# Patient Record
Sex: Male | Born: 1960 | Race: Black or African American | Hispanic: No | Marital: Married | Smoking: Never smoker
Health system: Southern US, Community
[De-identification: ages and names within clinical notes are randomized; demographics above are authoritative.]

## PROBLEM LIST (undated history)

## (undated) DIAGNOSIS — E785 Hyperlipidemia, unspecified: Secondary | ICD-10-CM

## (undated) DIAGNOSIS — R519 Headache, unspecified: Secondary | ICD-10-CM

## (undated) DIAGNOSIS — I10 Essential (primary) hypertension: Secondary | ICD-10-CM

## (undated) HISTORY — DX: Headache, unspecified: R51.9

## (undated) NOTE — *Deleted (*Deleted)
HEMATOLOGY/ONCOLOGY CLINIC NOTE  Date of Service: 03/24/2020  Patient Care Team: Farris Has, MD as PCP - General (Family Medicine)  CHIEF COMPLAINTS/PURPOSE OF CONSULTATION:  Thrombocytopenia and developing Anemia  HISTORY OF PRESENTING ILLNESS:   Ryan Nixon is a wonderful 37 y.o. male who has been referred to [place] by Dr Kateri Plummer for evaluation and management of thrombocytopenia and developing anemia. The pt reports that he is doing well overall.   The pt reports that the last time he felt like himself was in [place]. He knew that things were different when he was no longer able to play tennis, which he did so often before. Pt has been experiencing nausea, vomiting, fatigue and weight loss. He has had a 17-18 lb weight loss over the course of the last 8 months. His highest weight was around 139 lbs and his lowest was 119 lbs. Pt has had fevers accompanied by cold sweats and headaches over the last few months. His night sweats were drenching, but have since improved. This began around the same time as his other symptoms. He also notes hand peeling in both hands, over the entire palm. He has had a couple of recent episodes of memory loss. Once he wound up in [place] with no knowledge of how he got there. Pt has not had a Colonoscopy since he has turned 49 but has had an EGD, which was set up by his GI, Dr. Ewing Schlein. Nothing significant was found on his EGD. Dr. Ewing Schlein placed the pt on Omeprazole 2 weeks ago and the pt has noticed a severe decrease in his vomiting. He has been eating better and has even gained some of his weight back.   Pt has been taking his migraine medication once per week as prophylaxis and then only as needed. He has had migraine headaches for at least the last few years and does have a sensitivity to light and sound with his migraines. Pt has been on HTN medication for about 10 years and is currently on a half dose of his medication. He believes that his HTN is stable.  He denies any other new medications or symptoms. He recently had an brain MRI and has an appointment with a Neurologist tomorrow.   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 His mother passed last year and her death has had saddened him greatly. He is not sure if he is, or recently has been struggling with depression. His grandmother was diagnosed with dementia in her 47's. He drinks up to a 6 pack of beer when he plays tennis and has 3-4 glasses of wine per week. Pt does not drink much hard liquor at all. Pt has no dietary restrictions and eats at least 2 meals per day.   Of note prior to the patient's visit today, pt has had Brain MRI completed on 03/20/2019 with results revealing "1. No acute intracranial abnormality. 2. Several white matter lesions are identified, as described above. These lesions are abnormal but nonspecific, usually resulting from benign/remote/incidental causes (e.g. prior trauma/inflammation/demyelinization, or chronic ischemia associated with  migraines/atherosclerosis/other vasculopathies)."   Most recent lab results (03/15/2019) of CBC is as follows: WBC at 5.7K, RBC at 3.47, Hgb at 12.5, HCT at 36.5, MCV at 105.3, MCH at 36.0, MCHC at 34.1, RDW at 14.1, PLTs at 113K, MPV at 10.8, nRBC Abs at 0.01K, Neutro Rel at 69.4, nRBC Rel at 0.10, Lymphs Rel at 21.1, Mono Rel at 8.3, Eos Rel at 0.8, Baso Rel at  0.4, Neutro at 3.9K, Lymph Abs at 1.20K, Mono Abs at 0.5K, Eos Abs at 0.0K, Baso Abs at 0.0K, Glucose at 93, BUN at 8, Creatinine at 0.86, GFR Est AFR Am at 110, Sodium at 141, Potassium at 4.1, Chloride at 106, CO2 at 28, Anion gap at 11.8, Calcium at 9.7, Total Protein at 7.4, Albumin at 4.6, Total Bilirubin at 0.5, ALP at 49, AST at 47, ALT at 50.  03/15/2019 TSH at 1.29  On review of systems, pt reports nausea, improving vomiting, significant weight loss, fatigue, b/l hand peeling, improving night sweats. short-term memory loss and denies constipation, abdominal pain,  diarrhea, breathing changes, chest pain, new bone pain, new joint pain, pain along the spine and any other symptoms.   On PMHx the pt reports HTN, Migraines, HLD. On Social Hx the pt reports that he drinks 3-4 glasses of wine per week, up to 6 beers in 1 sitting occasionally.  On Family Hx the pt reports that his grandmother had dementia that was diagnosed in her 42's   INTERVAL HISTORY:  Ryan Nixon is a wonderful 6 y.o. male who is here for evaluation and management of thrombocytopenia and developing anemia. The patient's last visit with [place] was on 12/17/2019. The pt reports that he is doing well overall.  The pt reports ***  Of note since the patient's last visit, pt has had Bone Marrow Bx Report ([id number]) completed on 03/10/2020 with results revealing "BONE MARROW, ASPIRATE, CLOT, CORE: - Normocellular marrow with kappa predominant plasmacytosis."  Pt has had Cytogenetics ([id number]) completed on 03/10/2020 with results revealing Normal Male Karyotype.  Lab results (03/10/20) of CBC w/diff and CMP is as follows: all values are WNL except for RBC at 3.91, MCV at 102.6, Total Protein at 8.3. 03/10/2020 MMP shows all values are WNL except for IgG at 2034, Gamma Glob at 2.0, M Protein at 1.5  On review of systems, pt reports *** and denies ***and any other symptoms.   A&P: -Discussed pt labwork today, 03/24/20; *** -***   MEDICAL HISTORY:  Hyperlipidemia Hypertension   SURGICAL HISTORY: No known surgical history   SOCIAL HISTORY: Social History   Socioeconomic History  . Marital status: Married    Spouse name: Not on file  . Number of children: Not on file  . Years of education: Not on file  . Highest education level: Not on file  Occupational History  . Not on file  Tobacco Use  . Smoking status: Never Smoker  . Smokeless tobacco: Never Used  Substance and Sexual Activity  . Alcohol use: Yes    Alcohol/week: 2.0 standard drinks    Types: 1 Cans of beer, 1  Glasses of wine per week    Comment: occasionally  . Drug use: Not Currently  . Sexual activity: Not on file  Other Topics Concern  . Not on file  Social History Narrative  . Not on file   Social Determinants of Health   Financial Resource Strain:   . Difficulty of Paying Living Expenses: Not on file  Food Insecurity:   . Worried About Programme researcher, broadcasting/film/video in the Last Year: Not on file  . Ran Out of Food in the Last Year: Not on file  Transportation Needs:   . Lack of Transportation (Medical): Not on file  . Lack of Transportation (Non-Medical): Not on file  Physical Activity:   . Days of Exercise per Week: Not on file  . Minutes of Exercise per Session: Not on file  Stress:   . Feeling of Stress : Not on file  Social Connections:   . Frequency of Communication with Friends and Family: Not on file  . Frequency of Social Gatherings with Friends and Family: Not on file  . Attends Religious Services: Not on file  . Active Member of Clubs or Organizations: Not on file  . Attends Banker Meetings: Not on file  . Marital Status: Not on file  Intimate Partner Violence:   . Fear of Current or Ex-Partner: Not on file  . Emotionally Abused: Not on file  . Physically Abused: Not on file  . Sexually Abused: Not on file    FAMILY HISTORY: No family history on file.  ALLERGIES:  has no allergies on file. No known drug allergies   MEDICATIONS:  Current Outpatient Medications  Medication Sig Dispense Refill  . atorvastatin (LIPITOR) 20 MG tablet Take 20 mg by mouth daily.    . chlorthalidone (HYGROTON) 25 MG tablet Take  1/2    . omeprazole (PRILOSEC) 20 MG capsule Take 20 mg by mouth daily.    . ondansetron (ZOFRAN) 8 MG tablet     . Potassium Chloride ER 20 MEQ TBCR     . SUMAtriptan (IMITREX) 100 MG tablet Take 1 tablet with 600 mg of ibuprofen at the first sign of headache, may repeat in Imitrex only in 2 hours if needed.  Max 200/24hrs.    . Vitamin D,  Ergocalciferol, (DRISDOL) 1.25 MG (50000 UNIT) CAPS capsule TAKE 1 CAPSULE BY MOUTH ONE TIME PER WEEK 12 capsule 0   No current facility-administered medications for this visit.    REVIEW OF SYSTEMS:   A 10+ POINT REVIEW OF SYSTEMS WAS OBTAINED including neurology, dermatology, psychiatry, cardiac, respiratory, lymph, extremities, GI, GU, Musculoskeletal, constitutional, breasts, reproductive, HEENT.  All pertinent positives are noted in the HPI.  All others are negative.   PHYSICAL EXAMINATION: ECOG PERFORMANCE STATUS: 1 - Symptomatic but completely ambulatory  *** GENERAL:alert, in no acute distress and comfortable SKIN: no acute rashes, no significant lesions EYES: conjunctiva are pink and non-injected, sclera anicteric OROPHARYNX: MMM, no exudates, no oropharyngeal erythema or ulceration NECK: supple, no JVD LYMPH:  no palpable lymphadenopathy in the cervical, axillary or inguinal regions LUNGS: clear to auscultation b/l with normal respiratory effort HEART: regular rate & rhythm ABDOMEN:  normoactive bowel sounds , non tender, not distended. No palpable hepatosplenomegaly.  Extremity: no pedal edema PSYCH: alert & oriented x 3 with fluent speech NEURO: no focal motor/sensory deficits  LABORATORY DATA:  I have reviewed the data as listed  . CBC Latest Ref Rng & Units 03/10/2020 03/10/2020 12/12/2019  WBC 4.0 - 10.5 K/uL 5.0 4.1 4.9  Hemoglobin 13.0 - 17.0 g/dL 16.1 09.6 04.5  Hematocrit 39 - 52 % 40.8 40.1 39.2  Platelets 150 - 400 K/uL 183 155 164    . CMP Latest Ref Rng & Units 03/10/2020 12/12/2019 06/05/2019  Glucose 70 - 99 mg/dL 72 84 93  BUN 6 - 20 mg/dL 13 6 10   Creatinine 0.61 - 1.24 mg/dL 4.09 8.11 9.14  Sodium 135 - 145 mmol/L 143 140 142  Potassium 3.5 - 5.1 mmol/L 3.6 4.0 3.5  Chloride 98 - 111 mmol/L 106 103 105  CO2 22 - 32 mmol/L 26 29 29   Calcium 8.9 - 10.3 mg/dL 9.0 9.9 9.5  Total Protein 6.5 - 8.1 g/dL 8.3(H) 8.0 8.1  Total Bilirubin 0.3 - 1.2 mg/dL 1.1  1.0 1.2  Alkaline Phos 38 -  126 U/L 63 73 64  AST 15 - 41 U/L 36 44(H) 27  ALT 0 - 44 U/L 27 34 31   03/10/2020 Bone Marrow Bx Report ([id number]):   03/10/2020 Cytogenetics ([id number]):   RADIOGRAPHIC STUDIES: I have personally reviewed the radiological images as listed and agreed with the findings in the report. CT Biopsy  Result Date: 03/10/2020 INDICATION: 55 year old male with possible plasma cell dyscrasia. EXAM: CT GUIDED BONE MARROW ASPIRATION AND CORE BIOPSY Interventional Radiologist:  Sterling Big, MD MEDICATIONS: None. ANESTHESIA/SEDATION: 50 mcg fentanyl only.  This does not constitute moderate sedation. FLUOROSCOPY TIME:  None. COMPLICATIONS: None immediate. Estimated blood loss: <25 mL PROCEDURE: Informed written consent was obtained from the patient after a thorough discussion of the procedural risks, benefits and alternatives. All questions were addressed. Maximal Sterile Barrier Technique was utilized including caps, mask, sterile gowns, sterile gloves, sterile drape, hand hygiene and skin antiseptic. A timeout was performed prior to the initiation of the procedure. The patient was positioned prone and non-contrast localization CT was performed of the pelvis to demonstrate the iliac marrow spaces. Maximal barrier sterile technique utilized including caps, mask, sterile gowns, sterile gloves, large sterile drape, hand hygiene, and betadine prep. Under sterile conditions and local anesthesia, an 11 gauge coaxial bone biopsy needle was advanced into the right iliac marrow space. Needle position was confirmed with CT imaging. Initially, bone marrow aspiration was performed. Next, the 11 gauge outer cannula was utilized to obtain a right iliac bone marrow core biopsy. Needle was removed. Hemostasis was obtained with compression. The patient tolerated the procedure well. Samples were prepared with the cytotechnologist. IMPRESSION: Technically successful CT-guided right iliac bone  marrow aspiration and core biopsy. Electronically Signed   By: Malachy Moan M.D.   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 EXAM: CT GUIDED BONE MARROW ASPIRATION AND CORE BIOPSY Interventional Radiologist:  Sterling Big, MD MEDICATIONS: None. ANESTHESIA/SEDATION: 50 mcg fentanyl only.  This does not constitute moderate sedation. FLUOROSCOPY TIME:  None. COMPLICATIONS: None immediate. Estimated blood loss: <25 mL PROCEDURE: Informed written consent was obtained from the patient after a thorough discussion of the procedural risks, benefits and alternatives. All questions were addressed. Maximal Sterile Barrier Technique was utilized including caps, mask, sterile gowns, sterile gloves, sterile drape, hand hygiene and skin antiseptic. A timeout was performed prior to the initiation of the procedure. The patient was positioned prone and non-contrast localization CT was performed of the pelvis to demonstrate the iliac marrow spaces. Maximal barrier sterile technique utilized including caps, mask, sterile gowns, sterile gloves, large sterile drape, hand hygiene, and betadine prep. Under sterile conditions and local anesthesia, an 11 gauge coaxial bone biopsy needle was advanced into the right iliac marrow space. Needle position was confirmed with CT imaging. Initially, bone marrow aspiration was performed. Next, the 11 gauge outer cannula was utilized to obtain a right iliac bone marrow core biopsy. Needle was removed. Hemostasis was obtained with compression. The patient tolerated the procedure well. Samples were prepared with the cytotechnologist. IMPRESSION: Technically successful CT-guided right iliac bone marrow aspiration and core biopsy. Electronically Signed   By: Malachy Moan M.D.   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 No CT evidence of malignancy in the chest,  abdomen, or pelvis. No lymphadenopathy. 2. Hepatic steatosis. 3. Descending and sigmoid diverticulosis without evidence of diverticulitis. 4. 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 -Continue 19147 IU Vitamin D/Ergocalciferol weekly   FOLLOW UP: ***   The total time spent in the appt was *** minutes and more than 50% was on counseling and direct patient cares.  All of the patient's questions were answered with apparent satisfaction. The patient knows to call the clinic with any problems, questions or concerns.    Wyvonnia Lora MD MS AAHIVMS [facility] Hematology/Oncology Physician [facility]  (Office):       [phone] (Work cell):  [phone] (Fax):           [phone]  03/24/2020 9:21 AM  I, Carollee Herter, am acting as a scribe for Dr. Wyvonnia Lora.   {Add Production assistant, radio Statement}

---

## 2013-02-23 ENCOUNTER — Other Ambulatory Visit: Payer: Self-pay | Admitting: Interventional Cardiology

## 2013-02-23 DIAGNOSIS — E782 Mixed hyperlipidemia: Secondary | ICD-10-CM

## 2013-02-23 NOTE — Telephone Encounter (Signed)
Spoke with pt and he already completed his 8 Weeks of 50,000 Units of Vitamin D twice a week. Pt will come for labwork next week and if needs further prescriptions vitamin D then we will call in rx.

## 2013-02-28 ENCOUNTER — Other Ambulatory Visit: Payer: Self-pay

## 2013-08-15 ENCOUNTER — Encounter: Payer: Self-pay | Admitting: Interventional Cardiology

## 2013-09-18 ENCOUNTER — Ambulatory Visit: Payer: Self-pay | Admitting: Interventional Cardiology

## 2013-10-09 ENCOUNTER — Ambulatory Visit: Payer: Self-pay | Admitting: Interventional Cardiology

## 2013-10-10 ENCOUNTER — Encounter: Payer: Self-pay | Admitting: Interventional Cardiology

## 2018-10-27 ENCOUNTER — Other Ambulatory Visit: Payer: Self-pay | Admitting: Family Medicine

## 2018-10-27 DIAGNOSIS — R7989 Other specified abnormal findings of blood chemistry: Secondary | ICD-10-CM

## 2018-11-07 ENCOUNTER — Other Ambulatory Visit: Payer: Self-pay

## 2018-11-07 ENCOUNTER — Ambulatory Visit
Admission: RE | Admit: 2018-11-07 | Discharge: 2018-11-07 | Disposition: A | Discharge status: Home / Self Care | Payer: 59 | Source: Ambulatory Visit | Attending: Family Medicine | Admitting: Family Medicine

## 2018-11-07 DIAGNOSIS — R7989 Other specified abnormal findings of blood chemistry: Secondary | ICD-10-CM

## 2018-11-07 MED ORDER — IOPAMIDOL (ISOVUE-300) INJECTION 61%
100.0000 mL | Freq: Once | INTRAVENOUS | Status: AC | PRN
  Administered 2018-11-07: 100 mL via INTRAVENOUS

## 2019-03-30 ENCOUNTER — Telehealth: Payer: Self-pay | Admitting: Hematology

## 2019-03-30 NOTE — Telephone Encounter (Signed)
Received a new hem referral from Dr. Lysle Rubens for elevated wbc and platelets. [name] has been cld and scheduled to see Dr. Irene Limbo on 11/30 at 11am. He's been mde aware to arrive 15 minutes early.

## 2019-04-02 ENCOUNTER — Inpatient Hospital Stay: Payer: 59

## 2019-04-02 ENCOUNTER — Inpatient Hospital Stay: Payer: 59 | Attending: Hematology | Admitting: Hematology

## 2019-04-02 ENCOUNTER — Other Ambulatory Visit: Payer: Self-pay

## 2019-04-02 VITALS — BP 112/85 | HR 74 | Temp 98.0°F | Resp 18 | Wt 122.6 lb

## 2019-04-02 DIAGNOSIS — D539 Nutritional anemia, unspecified: Secondary | ICD-10-CM

## 2019-04-02 DIAGNOSIS — R634 Abnormal weight loss: Secondary | ICD-10-CM

## 2019-04-02 DIAGNOSIS — R591 Generalized enlarged lymph nodes: Secondary | ICD-10-CM | POA: Diagnosis not present

## 2019-04-02 DIAGNOSIS — D509 Iron deficiency anemia, unspecified: Secondary | ICD-10-CM | POA: Diagnosis present

## 2019-04-02 DIAGNOSIS — R1010 Upper abdominal pain, unspecified: Secondary | ICD-10-CM

## 2019-04-02 DIAGNOSIS — D696 Thrombocytopenia, unspecified: Secondary | ICD-10-CM | POA: Insufficient documentation

## 2019-04-02 DIAGNOSIS — R61 Generalized hyperhidrosis: Secondary | ICD-10-CM

## 2019-04-02 LAB — CMP (CANCER CENTER ONLY)
ALT: 27 U/L (ref 0–44)
AST: 35 U/L (ref 15–41)
Albumin: 4.8 g/dL (ref 3.5–5.0)
Alkaline Phosphatase: 55 U/L (ref 38–126)
Anion gap: 13 (ref 5–15)
BUN: 14 mg/dL (ref 6–20)
CO2: 27 mmol/L (ref 22–32)
Calcium: 10 mg/dL (ref 8.9–10.3)
Chloride: 99 mmol/L (ref 98–111)
Creatinine: 1.03 mg/dL (ref 0.61–1.24)
GFR, Est AFR Am: >60 mL/min (ref >60)
GFR, Estimated: >60 mL/min (ref >60)
Glucose, Bld: 100 mg/dL — ABNORMAL HIGH (ref 70–99)
Potassium: 3.8 mmol/L (ref 3.5–5.1)
Sodium: 139 mmol/L (ref 135–145)
Total Bilirubin: 1.8 mg/dL — ABNORMAL HIGH (ref 0.3–1.2)
Total Protein: 8.5 g/dL — ABNORMAL HIGH (ref 6.5–8.1)

## 2019-04-02 LAB — CBC WITH DIFFERENTIAL/PLATELET
Abs Immature Granulocytes: 0.02 10*3/uL (ref 0.00–0.07)
Basophils Absolute: 0 10*3/uL (ref 0.0–0.1)
Basophils Relative: 1 %
Eosinophils Absolute: 0 10*3/uL (ref 0.0–0.5)
Eosinophils Relative: 0 %
HCT: 41.1 % (ref 39.0–52.0)
Hemoglobin: 14 g/dL (ref 13.0–17.0)
Immature Granulocytes: 0 %
Lymphocytes Relative: 19 %
Lymphs Abs: 1.2 10*3/uL (ref 0.7–4.0)
MCH: 34.9 pg — ABNORMAL HIGH (ref 26.0–34.0)
MCHC: 34.1 g/dL (ref 30.0–36.0)
MCV: 102.5 fL — ABNORMAL HIGH (ref 80.0–100.0)
Monocytes Absolute: 0.5 10*3/uL (ref 0.1–1.0)
Monocytes Relative: 8 %
Neutro Abs: 4.4 10*3/uL (ref 1.7–7.7)
Neutrophils Relative %: 72 %
Platelets: 171 10*3/uL (ref 150–400)
RBC: 4.01 MIL/uL — ABNORMAL LOW (ref 4.22–5.81)
RDW: 12.2 % (ref 11.5–15.5)
WBC: 6.1 10*3/uL (ref 4.0–10.5)
nRBC: 0 % (ref 0.0–0.2)

## 2019-04-02 LAB — IMMATURE PLATELET FRACTION

## 2019-04-02 LAB — VITAMIN B12: Vitamin B-12: 346 pg/mL (ref 180–914)

## 2019-04-02 LAB — PLATELET BY CITRATE

## 2019-04-02 LAB — LACTATE DEHYDROGENASE: LDH: 171 U/L (ref 98–192)

## 2019-04-02 NOTE — Progress Notes (Signed)
HEMATOLOGY/ONCOLOGY CONSULTATION NOTE  Date of Service: 04/02/2019  Patient Care Team: London Pepper, MD as PCP - General (Family Medicine)  CHIEF COMPLAINTS/PURPOSE OF CONSULTATION:  Thrombocytopenia and developing Anemia  HISTORY OF PRESENTING ILLNESS:   Ryan Nixon is a wonderful 58 y.o. male who has been referred to [place] by Dr Orland Mustard for evaluation and management of thrombocytopenia and developing anemia. The pt reports that he is doing well overall.   The pt reports that the last time he felt like himself was in [place]. He knew that things were different when he was no longer able to play tennis, which he did so often before. Pt has been experiencing nausea, vomiting, fatigue and weight loss. He has had a 17-18 lb weight loss over the course of the last 8 months. His highest weight was around 139 lbs and his lowest was 119 lbs. Pt has had fevers accompanied by cold sweats and headaches over the last few months. His night sweats were drenching, but have since improved. This began around the same time as his other symptoms. He also notes hand peeling in both hands, over the entire palm. He has had a couple of recent episodes of memory loss. Once he wound up in [place] with no knowledge of how he got there. Pt has not had a Colonoscopy since he has turned 41 but has had an EGD, which was set up by his GI, Dr. Watt Climes. Nothing significant was found on his EGD. Dr. Watt Climes placed the pt on Omeprazole 2 weeks ago and the pt has noticed a severe decrease in his vomiting. He has been eating better and has even gained some of his weight back.   Pt has been taking his migraine medication once per week as prophylaxis and then only as needed. He has had migraine headaches for at least the last few years and does have a sensitivity to light and sound with his migraines. Pt has been on HTN medication for about 10 years and is currently on a half dose of his medication. He believes that his HTN is  stable. He denies any other new medications or symptoms. He recently had an brain MRI and has an appointment with a Neurologist tomorrow.   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 His mother passed last year and her death has had saddened him greatly. He is not sure if he is, or recently has been struggling with depression. His grandmother was diagnosed with dementia in her 73's. He drinks up to a 6 pack of beer when he plays tennis and has 3-4 glasses of wine per week. Pt does not drink much hard liquor at all. Pt has no dietary restrictions and eats at least 2 meals per day.   Of note prior to the patient's visit today, pt has had Brain MRI completed on 03/20/2019 with results revealing "1. No acute intracranial abnormality. 2. Several white matter lesions are identified, as described above. These lesions are abnormal but nonspecific, usually resulting from benign/remote/incidental causes (e.g. prior trauma/inflammation/demyelinization, or chronic ischemia associated with  migraines/atherosclerosis/other vasculopathies)."   Most recent lab results (03/15/2019) of CBC is as follows: WBC at 5.7K, RBC at 3.47, Hgb at 12.5, HCT at 36.5, MCV at 105.3, MCH at 36.0, MCHC at 34.1, RDW at 14.1, PLTs at 113K, MPV at 10.8, nRBC Abs at 0.01K, Neutro Rel at 69.4, nRBC Rel at 0.10, Lymphs Rel at 21.1, Mono Rel at 8.3, Eos Rel at 0.8, Baso Rel at  0.4, Neutro at 3.9K, Lymph Abs at 1.20K, Mono Abs at 0.5K, Eos Abs at 0.0K, Baso Abs at 0.0K, Glucose at 93, BUN at 8, Creatinine at 0.86, GFR Est AFR Am at 110, Sodium at 141, Potassium at 4.1, Chloride at 106, CO2 at 28, Anion gap at 11.8, Calcium at 9.7, Total Protein at 7.4, Albumin at 4.6, Total Bilirubin at 0.5, ALP at 49, AST at 47, ALT at 50.  03/15/2019 TSH at 1.29  On review of systems, pt reports nausea, improving vomiting, significant weight loss, fatigue, b/l hand peeling, improving night sweats. short-term memory loss and denies constipation,  abdominal pain, diarrhea, breathing changes, chest pain, new bone pain, new joint pain, pain along the spine and any other symptoms.   On PMHx the pt reports HTN, Migraines, HLD. On Social Hx the pt reports that he drinks 3-4 glasses of wine per week, up to 6 beers in 1 sitting occasionally.  On Family Hx the pt reports that his grandmother had dementia that was diagnosed in her 36's   MEDICAL HISTORY:  Hyperlipidemia Hypertension   SURGICAL HISTORY: No known surgical history   SOCIAL HISTORY: Social History   Socioeconomic History  . Marital status: Married    Spouse name: Not on file  . Number of children: Not on file  . Years of education: Not on file  . Highest education level: Not on file  Occupational History  . Not on file  Social Needs  . Financial resource strain: Not on file  . Food insecurity    Worry: Not on file    Inability: Not on file  . Transportation needs    Medical: Not on file    Non-medical: Not on file  Tobacco Use  . Smoking status: Not on file  Substance and Sexual Activity  . Alcohol use: Not on file  . Drug use: Not on file  . Sexual activity: Not on file  Lifestyle  . Physical activity    Days per week: Not on file    Minutes per session: Not on file  . Stress: Not on file  Relationships  . Social Herbalist on phone: Not on file    Gets together: Not on file    Attends religious service: Not on file    Active member of club or organization: Not on file    Attends meetings of clubs or organizations: Not on file    Relationship status: Not on file  . Intimate partner violence    Fear of current or ex partner: Not on file    Emotionally abused: Not on file    Physically abused: Not on file    Forced sexual activity: Not on file  Other Topics Concern  . Not on file  Social History Narrative  . Not on file    FAMILY HISTORY: No family history on file.  ALLERGIES:  has no allergies on file. No known drug allergies    MEDICATIONS:  No current outpatient medications on file.   No current facility-administered medications for this visit.     REVIEW OF SYSTEMS:    10 Point review of Systems was done is negative except as noted above.  PHYSICAL EXAMINATION: ECOG PERFORMANCE STATUS: 1 - Symptomatic but completely ambulatory  . Vitals:   04/02/19 1135  BP: 112/85  Pulse: 74  Resp: 18  Temp: 98 F (36.7 C)   Filed Weights   04/02/19 1135  Weight: 122 lb 9.6 oz (55.6 kg)   .  There is no height or weight on file to calculate BMI.  GENERAL:alert, in no acute distress and comfortable SKIN: no acute rashes, no significant lesions EYES: conjunctiva are pink and non-injected, sclera anicteric OROPHARYNX: MMM, no exudates, no oropharyngeal erythema or ulceration NECK: supple, no JVD LYMPH:  no palpable lymphadenopathy in the axillary or inguinal regions. small b/l cervical lymph nodes  LUNGS: clear to auscultation b/l with normal respiratory effort HEART: regular rate & rhythm ABDOMEN:  normoactive bowel sounds , non tender, not distended. Extremity: no pedal edema PSYCH: alert & oriented x 3 with fluent speech NEURO: no focal motor/sensory deficits  LABORATORY DATA:  I have reviewed the data as listed  . CBC Latest Ref Rng & Units 04/02/2019  WBC 4.0 - 10.5 K/uL 6.1  Hemoglobin 13.0 - 17.0 g/dL 14.0  Hematocrit 39.0 - 52.0 % 41.1  Platelets 150 - 400 K/uL 171    . CMP Latest Ref Rng & Units 04/02/2019  Glucose 70 - 99 mg/dL 100(H)  BUN 6 - 20 mg/dL 14  Creatinine 0.61 - 1.24 mg/dL 1.03  Sodium 135 - 145 mmol/L 139  Potassium 3.5 - 5.1 mmol/L 3.8  Chloride 98 - 111 mmol/L 99  CO2 22 - 32 mmol/L 27  Calcium 8.9 - 10.3 mg/dL 10.0  Total Protein 6.5 - 8.1 g/dL 8.5(H)  Total Bilirubin 0.3 - 1.2 mg/dL 1.8(H)  Alkaline Phos 38 - 126 U/L 55  AST 15 - 41 U/L 35  ALT 0 - 44 U/L 27     RADIOGRAPHIC STUDIES: I have personally reviewed the radiological images as listed and agreed with  the findings in the report. No results found.  ASSESSMENT & PLAN:   31 with   1) Mild thrombocytopenia 2) Mild drop in hgb with mild borderline anemia 3) significant unexplained weight loss PLAN: -Discussed patient's most recent labs from 03/15/2019, WBC at 5.7K, RBC at 3.47, Hgb at 12.5, HCT at 36.5, MCV at 105.3, MCH at 36.0, MCHC at 34.1, RDW at 14.1, PLTs at 113K, MPV at 10.8, nRBC Abs at 0.01K, Neutro Rel at 69.4, nRBC Rel at 0.10, Lymphs Rel at 21.1, Mono Rel at 8.3, Eos Rel at 0.8, Baso Rel at 0.4, Neutro at 3.9K, Lymph Abs at 1.20K, Mono Abs at 0.5K, Eos Abs at 0.0K, Baso Abs at 0.0K, Glucose at 93, BUN at 8, Creatinine at 0.86, GFR Est AFR Am at 110, Sodium at 141, Potassium at 4.1, Chloride at 106, CO2 at 28, Anion gap at 11.8, Calcium at 9.7, Total Protein at 7.4, Albumin at 4.6, Total Bilirubin at 0.5, ALP at 49, AST at 47, ALT at 50. TSH at 1.29 -Pt is macrocytic, which could be due to nutritional deficiency, RBC loss due to hemolysis or something that can be infiltrating the BM to increase the size of RBCs  -Will consider a BM Bx based on the results of labs and CT C/A/P -Advised pt to continue f/u with Neurologist  -Will get labs today  -Will get CT C/A/P in 1 week -Will see back in 2 weeks via phone    FOLLOW UP: Labs today CT neck/chest/abd/pelvis in 1 week Phone visit with Dr Irene Limbo in 2 weeks  All of the patients questions were answered with apparent satisfaction. The patient knows to call the clinic with any problems, questions or concerns.  I spent 35 mins counseling the patient face to face. The total time spent in the appointment was 45 minutes and more than 50% was on counseling and  direct patient cares.    [name] MD [place] AAHIVMS [facility] Hematology/Oncology Physician [facility]  (Office):       [phone] (Work cell):  [phone] (Fax):           [phone]  04/02/2019 5:34 PM  I, Yevette Edwards, am acting as a scribe for Dr.  [name].   .I have reviewed the above documentation for accuracy and completeness, and I agree with the above. Brunetta Genera MD

## 2019-04-02 NOTE — Patient Instructions (Signed)
Thank you for choosing [facility] to provide your oncology and hematology care.   Should you have questions after your visit to the [facility] ([facility]), please contact this office at [phone] between 8:30 AM and 4:30 PM.  Voice mails left after 4:00 PM may not be returned until the following business day.  Calls received after 4:30 PM will be answered by an off-site Nurse Triage Line.    Prescription Refills:  Please have your pharmacy contact us directly for most prescription requests.  Contact the office directly for refills of narcotics (pain medications). Allow 48-72 hours for refills.  Appointments: Please contact the [facility] scheduling department [phone] for questions regarding [facility] appointment scheduling.  Contact the schedulers with any scheduling changes so that your appointment can be rescheduled in a timely manner.   Central Scheduling for [place] [phone] - Call to schedule procedures such as PET scans, CT scans, MRI, Ultrasound, etc.  To afford each patient quality time with our providers, please arrive 30 minutes before your scheduled appointment time.  If you arrive late for your appointment, you may be asked to reschedule.  We strive to give you quality time with our providers, and arriving late affects you and other patients whose appointments are after yours. If you are a no show for multiple scheduled visits, you may be dismissed from the clinic at the providers discretion.     Resources: [facility] Social Workers [phone] for additional information on assistance programs --Anne Cunningham/Abigail Elmore  [place] DSS  [phone]: Information regarding food stamps, Medicaid, and utility assistance [place] 336-333-6589   [place] [employer]'s shared-ride transportation service for eligible riders who have a disability that prevents them from riding the fixed route bus.   Medicare Rights Center 800-333-4114 Helps people with  Medicare understand their rights and benefits, navigate the Medicare system, and secure the quality healthcare they deserve [employer] 800-227-2345 Assists patients locate various types of support and financial assistance Cancer Care: 1-800-813-HOPE (4673) Provides financial assistance, online support groups, medication/co-pay assistance.   Transportation Assistance for appointments at [facility]: Transportation Coordinator [phone]  Again, thank you for choosing [facility] for your care.       

## 2019-04-03 ENCOUNTER — Telehealth: Payer: Self-pay | Admitting: Hematology

## 2019-04-03 ENCOUNTER — Encounter: Payer: Self-pay | Admitting: Neurology

## 2019-04-03 ENCOUNTER — Ambulatory Visit: Payer: 59 | Admitting: Neurology

## 2019-04-03 VITALS — BP 105/75 | HR 95 | Temp 97.6°F | Ht 67.0 in | Wt 126.0 lb

## 2019-04-03 DIAGNOSIS — R413 Other amnesia: Secondary | ICD-10-CM | POA: Diagnosis not present

## 2019-04-03 DIAGNOSIS — G43009 Migraine without aura, not intractable, without status migrainosus: Secondary | ICD-10-CM

## 2019-04-03 DIAGNOSIS — G3184 Mild cognitive impairment, so stated: Secondary | ICD-10-CM

## 2019-04-03 LAB — MULTIPLE MYELOMA PANEL, SERUM
Albumin SerPl Elph-Mcnc: 4.5 g/dL — ABNORMAL HIGH (ref 2.9–4.4)
Albumin/Glob SerPl: 1.3 (ref 0.7–1.7)
Alpha 1: 0.2 g/dL (ref 0.0–0.4)
Alpha2 Glob SerPl Elph-Mcnc: 0.5 g/dL (ref 0.4–1.0)
B-Globulin SerPl Elph-Mcnc: 1 g/dL (ref 0.7–1.3)
Gamma Glob SerPl Elph-Mcnc: 1.8 g/dL (ref 0.4–1.8)
Globulin, Total: 3.6 g/dL (ref 2.2–3.9)
IgA: 126 mg/dL (ref 90–386)
IgG (Immunoglobin G), Serum: 2133 mg/dL — ABNORMAL HIGH (ref 603–1613)
IgM (Immunoglobulin M), Srm: 44 mg/dL (ref 20–172)
M Protein SerPl Elph-Mcnc: 1.4 g/dL — ABNORMAL HIGH
Total Protein ELP: 8.1 g/dL (ref 6.0–8.5)

## 2019-04-03 LAB — FOLATE RBC
Folate, Hemolysate: 306 ng/mL
Folate, RBC: 754 ng/mL (ref >498)
Hematocrit: 40.6 % (ref 37.5–51.0)

## 2019-04-03 LAB — KAPPA/LAMBDA LIGHT CHAINS
Kappa free light chain: 18 mg/L (ref 3.3–19.4)
Kappa, lambda light chain ratio: 2.25 — ABNORMAL HIGH (ref 0.26–1.65)
Lambda free light chains: 8 mg/L (ref 5.7–26.3)

## 2019-04-03 LAB — HAPTOGLOBIN: Haptoglobin: 43 mg/dL (ref 29–370)

## 2019-04-03 NOTE — Telephone Encounter (Signed)
Scheduled appt per 11/30 los.  Spoke with pt and he is aware of the appt date and time.

## 2019-04-03 NOTE — Progress Notes (Signed)
[facility] [address] [place]. [place] [address] [phone]       OFFICE CONSULT NOTE  Ryan Nixon Date of Birth:  17-Feb-1961 Medical Record Number:  [id number]   Referring MD: Ryan Nixon  Reason for Referral: Migraines and disorientation  HPI: [name] is a pleasant 58 year old African-American male with past medical history of migraines, hyperlipidemia and recent diagnosis of anemia and thrombocytopenia and determine etiology.  History is obtained from him and review of referral notes and electronic medical records. Patient states he has a 10-year history of migraine headaches which of the common type without any vision symptoms or orals.  The headache begins with a pulsating vein of the left temple followed by initially mild headache which if it does not take anything goes on to become a moderate to severe headache which may last for hours.  There is some nausea and occasional vomiting.  There is light and sound sensitivity.  He needs to lie down and stop what he is doing.  He used to usually take ibuprofen 600 mg at the onset if this did not relieve he would take Imitrex which would work quite well.  Patient recently started having more nausea vomiting and saw his primary care physician who ordered some tests he was found to have thrombocytopenia and he has been referred to oncology and was seen yesterday and has had lots of lab work done the results of which are not yet back.  He was also found to have mild anemia.  Patient states his migraine headaches are more or less fairly steady occur once or twice a month at the most.  He has never been on prophylactic drugs.  Imitrex seems to work quite well.  He denies any focal neurological symptoms accompanying his migraines.  However he had an episode a month ago when he was driving he is lost track of time for short.Marland Kitchen  He found himself in the wrist area and had temporarily lost his bearings and did not know where he  was.  He had to call somebody to figure out where he was and subsequently he was able to drive without problem.  On inquiry he admits to having noted some short-term memory difficulties this year.  He has trouble remembering recent information and appointments at times.  Is never gotten lost.  Is still independent and with activities of daily living.  He denies any worsening headaches, gait or balance problems.  There is no history of seizures, significant loss of consciousness or head injury.  There is no family history of dementia or seizures.  Patient states he had MRI scan of the brain done 2 weeks ago at tried imaging but I do not have either the films of the reports but the patient informs me that it was normal. ROS:   14 system review of systems is positive for memory loss, confusion, disorientation, headache and all other systems negative  PMH: No past medical history on file.  Social History:  Social History   Socioeconomic History  . Marital status: Married    Spouse name: Not on file  . Number of children: Not on file  . Years of education: Not on file  . Highest education level: Not on file  Occupational History  . Not on file  Social Needs  . Financial resource strain: Not on file  . Food insecurity    Worry: Not on file    Inability: Not on file  . Transportation needs  Medical: Not on file    Non-medical: Not on file  Tobacco Use  . Smoking status: Never Smoker  . Smokeless tobacco: Never Used  Substance and Sexual Activity  . Alcohol use: Yes    Alcohol/week: 2.0 standard drinks    Types: 1 Cans of beer, 1 Glasses of wine per week    Comment: occasionally  . Drug use: Not Currently  . Sexual activity: Not on file  Lifestyle  . Physical activity    Days per week: Not on file    Minutes per session: Not on file  . Stress: Not on file  Relationships  . Social Herbalist on phone: Not on file    Gets together: Not on file    Attends religious  service: Not on file    Active member of club or organization: Not on file    Attends meetings of clubs or organizations: Not on file    Relationship status: Not on file  . Intimate partner violence    Fear of current or ex partner: Not on file    Emotionally abused: Not on file    Physically abused: Not on file    Forced sexual activity: Not on file  Other Topics Concern  . Not on file  Social History Narrative  . Not on file    Medications:   Current Outpatient Medications on File Prior to Visit  Medication Sig Dispense Refill  . atorvastatin (LIPITOR) 20 MG tablet Take 20 mg by mouth daily.    . chlorthalidone (HYGROTON) 25 MG tablet     . omeprazole (PRILOSEC) 20 MG capsule Take 20 mg by mouth daily.    . ondansetron (ZOFRAN) 8 MG tablet     . Potassium Chloride ER 20 MEQ TBCR     . SUMAtriptan (IMITREX) 100 MG tablet Take 1 tablet with 600 mg of ibuprofen at the first sign of headache, may repeat in Imitrex only in 2 hours if needed.  Max 200/24hrs.     No current facility-administered medications on file prior to visit.     Allergies:  Not on File  Physical Exam General: Frail middle-aged African-American male seated, in no evident distress Head: head normocephalic and atraumatic.   Neck: supple with no carotid or supraclavicular bruits Cardiovascular: regular rate and rhythm, no murmurs Musculoskeletal: no deformity Skin:  no rash/petichiae Vascular:  Normal pulses all extremities  Neurologic Exam Mental Status: Awake and fully alert. Oriented to place and time. Recent and remote memory intact. Attention span, concentration and fund of knowledge appropriate. Mood and affect appropriate.  Diminished recall 0/3.  Able to name 12 animals which can walk on 4 legs.  Does not appear depressed. Cranial Nerves: Fundoscopic exam reveals sharp disc margins. Pupils equal, briskly reactive to light. Extraocular movements full without nystagmus. Visual fields full to confrontation.  Hearing intact. Facial sensation intact. Face, tongue, palate moves normally and symmetrically.  Motor: Normal bulk and tone. Normal strength in all tested extremity muscles. Sensory.: intact to touch , pinprick , position and vibratory sensation.  Coordination: Rapid alternating movements normal in all extremities. Finger-to-nose and heel-to-shin performed accurately bilaterally. Gait and Station: Arises from chair without difficulty. Stance is normal. Gait demonstrates normal stride length and balance . Able to heel, toe and tandem walk without difficulty.  Reflexes: 1+ and symmetric. Toes downgoing.       ASSESSMENT: 58 year old male with longstanding history of migraines which appears stable as well as mild cognitive impairment  and one transient episode of disorientation of unclear etiology.  Negative recent MRI scan of the brain     PLAN: I had a long discussion with the patient with regards to his migraine headaches which appear fairly stable and well controlled on Imitrex for symptomatic relief.  I advised him to avoid migraine triggers.  I recommend he switch from ibuprofen as needed to Tylenol due to his thrombocytopenia.  We also discussed his episode of brief memory loss and disorientation as well as short-term memory difficulties and recommend checking memory panel labs and EEG.  Greater than 50% time during this 45-minute consultation visit was spent on counseling and coordination of care about his episode of transient disorientation, short-term memory difficulties and migraines and answering questions.  I also advised memory compensation strategies.  He will return for follow-up in the future in 3 months or call earlier if necessary. Antony Contras, MD  [facility] [address] [place], [place] [address]  Phone [phone] Fax [phone] Note: This document was prepared with digital dictation and possible smart phrase technology. Any  transcriptional errors that result from this process are unintentional.

## 2019-04-03 NOTE — Patient Instructions (Signed)
I had a long discussion with the patient with regards to his migraine headaches which appear fairly stable and well controlled on Imitrex for symptomatic relief.  I advised him to avoid migraine triggers.  I recommend he switch from ibuprofen as needed to Tylenol due to his thrombocytopenia.  We also discussed his episode of brief memory loss and disorientation as well as short-term memory difficulties and recommend checking memory panel labs and EEG.  I also advised memory compensation strategies.  He will return for follow-up in the future in 3 months or call earlier if necessary. Memory Compensation Strategies  1. Use "WARM" strategy.  W= write it down  A= associate it  R= repeat it  M= make a mental note  2.   You can keep a Social worker.  Use a 3-ring notebook with sections for the following: calendar, important names and phone numbers,  medications, doctors' names/phone numbers, lists/reminders, and a section to journal what you did  each day.   3.    Use a calendar to write appointments down.  4.    Write yourself a schedule for the day.  This can be placed on the calendar or in a separate section of the Memory Notebook.  Keeping a  regular schedule can help memory.  5.    Use medication organizer with sections for each day or morning/evening pills.  You may need help loading it  6.    Keep a basket, or pegboard by the door.  Place items that you need to take out with you in the basket or on the pegboard.  You may also want to  include a message board for reminders.  7.    Use sticky notes.  Place sticky notes with reminders in a place where the task is performed.  For example: " turn off the  stove" placed by the stove, "lock the door" placed on the door at eye level, " take your medications" on  the bathroom mirror or by the place where you normally take your medications.  8.    Use alarms/timers.  Use while cooking to remind yourself to check on food or as a reminder to take  your medicine, or as a  reminder to make a call, or as a reminder to perform another task, etc.  Migraine Headache A migraine headache is a very strong throbbing pain on one side or both sides of your head. This type of headache can also cause other symptoms. It can last from 4 hours to 3 days. Talk with your doctor about what things may bring on (trigger) this condition. What are the causes? The exact cause of this condition is not known. This condition may be triggered or caused by:  Drinking alcohol.  Smoking.  Taking medicines, such as: ? Medicine used to treat chest pain (nitroglycerin). ? Birth control pills. ? Estrogen. ? Some blood pressure medicines.  Eating or drinking certain products.  Doing physical activity. Other things that may trigger a migraine headache include:  Having a menstrual period.  Pregnancy.  Hunger.  Stress.  Not getting enough sleep or getting too much sleep.  Weather changes.  Tiredness (fatigue). What increases the risk?  Being 35-65 years old.  Being male.  Having a family history of migraine headaches.  Being Caucasian.  Having depression or anxiety.  Being very overweight. What are the signs or symptoms?  A throbbing pain. This pain may: ? Happen in any area of the head, such as on  one side or both sides. ? Make it hard to do daily activities. ? Get worse with physical activity. ? Get worse around bright lights or loud noises.  Other symptoms may include: ? Feeling sick to your stomach (nauseous). ? Vomiting. ? Dizziness. ? Being sensitive to bright lights, loud noises, or smells.  Before you get a migraine headache, you may get warning signs (an aura). An aura may include: ? Seeing flashing lights or having blind spots. ? Seeing bright spots, halos, or zigzag lines. ? Having tunnel vision or blurred vision. ? Having numbness or a tingling feeling. ? Having trouble talking. ? Having weak muscles.  Some people  have symptoms after a migraine headache (postdromal phase), such as: ? Tiredness. ? Trouble thinking (concentrating). How is this treated?  Taking medicines that: ? Relieve pain. ? Relieve the feeling of being sick to your stomach. ? Prevent migraine headaches.  Treatment may also include: ? Having acupuncture. ? Avoiding foods that bring on migraine headaches. ? Learning ways to control your body functions (biofeedback). ? Therapy to help you know and deal with negative thoughts (cognitive behavioral therapy). Follow these instructions at home: Medicines  Take over-the-counter and prescription medicines only as told by your doctor.  Ask your doctor if the medicine prescribed to you: ? Requires you to avoid driving or using heavy machinery. ? Can cause trouble pooping (constipation). You may need to take these steps to prevent or treat trouble pooping:  Drink enough fluid to keep your pee (urine) pale yellow.  Take over-the-counter or prescription medicines.  Eat foods that are high in fiber. These include beans, whole grains, and fresh fruits and vegetables.  Limit foods that are high in fat and sugar. These include fried or sweet foods. Lifestyle  Do not drink alcohol.  Do not use any products that contain nicotine or tobacco, such as cigarettes, e-cigarettes, and chewing tobacco. If you need help quitting, ask your doctor.  Get at least 8 hours of sleep every night.  Limit and deal with stress. General instructions      Keep a journal to find out what may bring on your migraine headaches. For example, write down: ? What you eat and drink. ? How much sleep you get. ? Any change in what you eat or drink. ? Any change in your medicines.  If you have a migraine headache: ? Avoid things that make your symptoms worse, such as bright lights. ? It may help to lie down in a dark, quiet room. ? Do not drive or use heavy machinery. ? Ask your doctor what activities are  safe for you.  Keep all follow-up visits as told by your doctor. This is important. Contact a doctor if:  You get a migraine headache that is different or worse than others you have had.  You have more than 15 headache days in one month. Get help right away if:  Your migraine headache gets very bad.  Your migraine headache lasts longer than 72 hours.  You have a fever.  You have a stiff neck.  You have trouble seeing.  Your muscles feel weak or like you cannot control them.  You start to lose your balance a lot.  You start to have trouble walking.  You pass out (faint).  You have a seizure. Summary  A migraine headache is a very strong throbbing pain on one side or both sides of your head. These headaches can also cause other symptoms.  This condition may  be treated with medicines and changes to your lifestyle.  Keep a journal to find out what may bring on your migraine headaches.  Contact a doctor if you get a migraine headache that is different or worse than others you have had.  Contact your doctor if you have more than 15 headache days in a month. This information is not intended to replace advice given to you by your health care provider. Make sure you discuss any questions you have with your health care provider. Document Released: 01/27/2008 Document Revised: 08/11/2018 Document Reviewed: 06/01/2018 [employer]vier Patient Education  2020 [employer].

## 2019-04-04 LAB — COPPER, SERUM: Copper: 102 ug/dL (ref 72–166)

## 2019-04-05 ENCOUNTER — Telehealth: Payer: Self-pay

## 2019-04-05 LAB — DEMENTIA PANEL
Homocysteine: 17.4 umol/L — ABNORMAL HIGH (ref 0.0–14.5)
RPR Ser Ql: NONREACTIVE
TSH: 2.25 u[IU]/mL (ref 0.450–4.500)
Vitamin B-12: 446 pg/mL (ref 232–1245)

## 2019-04-05 NOTE — Telephone Encounter (Signed)
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  In case he is a smoker he needs to quit.  He needs to start taking folic acid 1 mg daily which should be available over-the-counter

## 2019-04-05 NOTE — Telephone Encounter (Signed)
I called pt that his thyroid level and b12 were normal. His homocysteine levels are elevated. PT stated he does not smoke but does drink occasionally. I advise pt per Dr.Sethi to start taking folic acid 1mg  daily which is available over the counter at any drug store. PT verbalized understanding.  ------

## 2019-04-10 ENCOUNTER — Ambulatory Visit (HOSPITAL_COMMUNITY): Payer: 59

## 2019-04-10 ENCOUNTER — Encounter (HOSPITAL_COMMUNITY): Payer: Self-pay

## 2019-04-10 ENCOUNTER — Other Ambulatory Visit: Payer: Self-pay

## 2019-04-10 ENCOUNTER — Ambulatory Visit (HOSPITAL_COMMUNITY)
Admission: RE | Admit: 2019-04-10 | Discharge: 2019-04-10 | Disposition: A | Discharge status: Home / Self Care | Payer: 59 | Source: Ambulatory Visit | Attending: Hematology | Admitting: Hematology

## 2019-04-10 DIAGNOSIS — R634 Abnormal weight loss: Secondary | ICD-10-CM

## 2019-04-10 DIAGNOSIS — R1010 Upper abdominal pain, unspecified: Secondary | ICD-10-CM | POA: Insufficient documentation

## 2019-04-10 DIAGNOSIS — D696 Thrombocytopenia, unspecified: Secondary | ICD-10-CM | POA: Diagnosis present

## 2019-04-10 DIAGNOSIS — R61 Generalized hyperhidrosis: Secondary | ICD-10-CM

## 2019-04-10 DIAGNOSIS — R591 Generalized enlarged lymph nodes: Secondary | ICD-10-CM | POA: Diagnosis present

## 2019-04-10 DIAGNOSIS — D539 Nutritional anemia, unspecified: Secondary | ICD-10-CM

## 2019-04-10 MED ORDER — IOHEXOL 300 MG/ML  SOLN
100.0000 mL | Freq: Once | INTRAMUSCULAR | Status: AC | PRN
  Administered 2019-04-10: 100 mL via INTRAVENOUS

## 2019-04-16 ENCOUNTER — Ambulatory Visit (INDEPENDENT_AMBULATORY_CARE_PROVIDER_SITE_OTHER): Payer: Managed Care, Other (non HMO) | Admitting: Neurology

## 2019-04-16 ENCOUNTER — Telehealth: Payer: Self-pay | Admitting: Hematology

## 2019-04-16 ENCOUNTER — Other Ambulatory Visit: Payer: Self-pay

## 2019-04-16 DIAGNOSIS — R413 Other amnesia: Secondary | ICD-10-CM

## 2019-04-16 DIAGNOSIS — R410 Disorientation, unspecified: Secondary | ICD-10-CM | POA: Diagnosis not present

## 2019-04-16 NOTE — Progress Notes (Signed)
HEMATOLOGY/ONCOLOGY CONSULTATION NOTE  Date of Service: 04/17/2019  Patient Care Team: London Pepper, MD as PCP - General (Family Medicine)  CHIEF COMPLAINTS/PURPOSE OF CONSULTATION:  Thrombocytopenia and developing Anemia  HISTORY OF PRESENTING ILLNESS:   Ryan Nixon is a wonderful 58 y.o. male who has been referred to [place] by Dr Orland Mustard for evaluation and management of thrombocytopenia and developing anemia. The pt reports that he is doing well overall.   The pt reports that the last time he felt like himself was in [place]. He knew that things were different when he was no longer able to play tennis, which he did so often before. Pt has been experiencing nausea, vomiting, fatigue and weight loss. He has had a 17-18 lb weight loss over the course of the last 8 months. His highest weight was around 139 lbs and his lowest was 119 lbs. Pt has had fevers accompanied by cold sweats and headaches over the last few months. His night sweats were drenching, but have since improved. This began around the same time as his other symptoms. He also notes hand peeling in both hands, over the entire palm. He has had a couple of recent episodes of memory loss. Once he wound up in [place] with no knowledge of how he got there. Pt has not had a Colonoscopy since he has turned 13 but has had an EGD, which was set up by his GI, Dr. Watt Climes. Nothing significant was found on his EGD. Dr. Watt Climes placed the pt on Omeprazole 2 weeks ago and the pt has noticed a severe decrease in his vomiting. He has been eating better and has even gained some of his weight back.   Pt has been taking his migraine medication once per week as prophylaxis and then only as needed. He has had migraine headaches for at least the last few years and does have a sensitivity to light and sound with his migraines. Pt has been on HTN medication for about 10 years and is currently on a half dose of his medication. He believes that his HTN is  stable. He denies any other new medications or symptoms. He recently had an brain MRI and has an appointment with a Neurologist tomorrow.   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 His mother passed last year and her death has had saddened him greatly. He is not sure if he is, or recently has been struggling with depression. His grandmother was diagnosed with dementia in her 65's. He drinks up to a 6 pack of beer when he plays tennis and has 3-4 glasses of wine per week. Pt does not drink much hard liquor at all. Pt has no dietary restrictions and eats at least 2 meals per day.   Of note prior to the patient's visit today, pt has had Brain MRI completed on 03/20/2019 with results revealing "1. No acute intracranial abnormality. 2. Several white matter lesions are identified, as described above. These lesions are abnormal but nonspecific, usually resulting from benign/remote/incidental causes (e.g. prior trauma/inflammation/demyelinization, or chronic ischemia associated with  migraines/atherosclerosis/other vasculopathies)."   Most recent lab results (03/15/2019) of CBC is as follows: WBC at 5.7K, RBC at 3.47, Hgb at 12.5, HCT at 36.5, MCV at 105.3, MCH at 36.0, MCHC at 34.1, RDW at 14.1, PLTs at 113K, MPV at 10.8, nRBC Abs at 0.01K, Neutro Rel at 69.4, nRBC Rel at 0.10, Lymphs Rel at 21.1, Mono Rel at 8.3, Eos Rel at 0.8, Baso Rel at  0.4, Neutro at 3.9K, Lymph Abs at 1.20K, Mono Abs at 0.5K, Eos Abs at 0.0K, Baso Abs at 0.0K, Glucose at 93, BUN at 8, Creatinine at 0.86, GFR Est AFR Am at 110, Sodium at 141, Potassium at 4.1, Chloride at 106, CO2 at 28, Anion gap at 11.8, Calcium at 9.7, Total Protein at 7.4, Albumin at 4.6, Total Bilirubin at 0.5, ALP at 49, AST at 47, ALT at 50.  03/15/2019 TSH at 1.29  On review of systems, pt reports nausea, improving vomiting, significant weight loss, fatigue, b/l hand peeling, improving night sweats. short-term memory loss and denies constipation,  abdominal pain, diarrhea, breathing changes, chest pain, new bone pain, new joint pain, pain along the spine and any other symptoms.   On PMHx the pt reports HTN, Migraines, HLD. On Social Hx the pt reports that he drinks 3-4 glasses of wine per week, up to 6 beers in 1 sitting occasionally.  On Family Hx the pt reports that his grandmother had dementia that was diagnosed in her 41's   INTERVAL HISTORY:   I connected with  Ryan Nixon on 04/17/19 by telephone and verified that I am speaking with the correct person using two identifiers.   I discussed the limitations of evaluation and management by telemedicine. The patient expressed understanding and agreed to proceed.  Other persons participating in the visit and their role in the encounter:     -Yevette Edwards, Medical Scribe  Patient's location: Home Provider's location: [facility] [place] is a wonderful 58 y.o. male who is here for evaluation and management of thrombocytopenia and developing anemia. The patient's last visit with [place] was on 04/02/2019. The pt reports that he is doing well overall.  The pt reports that he has seen Dr. Leonie Man, Neurologist, who found nothing concerning on his MRI. Pt has also had some labwork done for Dr. Leonie Man and an EEG, which has not yet resulted. Pt has felt better since our last visit. He has had significant improvement in his fatigue and is back to playing tennis at this time.   Of note since the patient's last visit, pt has had CT C/A/P ([id number]) ([id number]) completed on 04/10/2019 with results revealing "1. No CT evidence of malignancy in the chest, abdomen, or pelvis. No lymphadenopathy. 2. Hepatic steatosis. 3. Descending and sigmoid diverticulosis without evidence of diverticulitis. 4. Mild, diffusely sclerotic appearance of the skeleton without focal lesion noted, of uncertain significance, most commonly seen with renal osteodystrophy."  Lab results  (04/02/19) of CBC w/diff and CMP is as follows: all values are WNL except for RBC at 4.01, MCV at 102.5, MCH at 34.9, Glucose at 100, Total Protein at 8.5, Total Bilirubin at 1.8. 04/02/2019 LDH at 171 04/02/2019 Copper at 102 04/02/2019 Haptoglobin at 43 04/02/2019 Vitamin B12 at 346 04/02/2019 Folate Hemolysate at 306.0, HCT at 40.6, Folate RBC at 754 04/02/2019 K/L light chains is as follows: all values are WNL except for K/L light chain ratio at 2.25 04/02/2019 MMP is as follows: all values are WNL except for IgG at 2133, Albumin at 4.5, M Protein at 1.4.   On review of systems, pt reports improving night sweats and denies fatigue, weight loss, new bone pains and any other symptoms.   MEDICAL HISTORY:  Hyperlipidemia Hypertension   SURGICAL HISTORY: No known surgical history   SOCIAL HISTORY: Social History   Socioeconomic History  . Marital status: Married    Spouse name: Not on file  .  Number of children: Not on file  . Years of education: Not on file  . Highest education level: Not on file  Occupational History  . Not on file  Tobacco Use  . Smoking status: Never Smoker  . Smokeless tobacco: Never Used  Substance and Sexual Activity  . Alcohol use: Yes    Alcohol/week: 2.0 standard drinks    Types: 1 Cans of beer, 1 Glasses of wine per week    Comment: occasionally  . Drug use: Not Currently  . Sexual activity: Not on file  Other Topics Concern  . Not on file  Social History Narrative  . Not on file   Social Determinants of Health   Financial Resource Strain:   . Difficulty of Paying Living Expenses: Not on file  Food Insecurity:   . Worried About Charity fundraiser in the Last Year: Not on file  . Ran Out of Food in the Last Year: Not on file  Transportation Needs:   . Lack of Transportation (Medical): Not on file  . Lack of Transportation (Non-Medical): Not on file  Physical Activity:   . Days of Exercise per Week: Not on file  . Minutes of Exercise  per Session: Not on file  Stress:   . Feeling of Stress : Not on file  Social Connections:   . Frequency of Communication with Friends and Family: Not on file  . Frequency of Social Gatherings with Friends and Family: Not on file  . Attends Religious Services: Not on file  . Active Member of Clubs or Organizations: Not on file  . Attends Archivist Meetings: Not on file  . Marital Status: Not on file  Intimate Partner Violence:   . Fear of Current or Ex-Partner: Not on file  . Emotionally Abused: Not on file  . Physically Abused: Not on file  . Sexually Abused: Not on file    FAMILY HISTORY: No family history on file.  ALLERGIES:  has no allergies on file. No known drug allergies   MEDICATIONS:  Current Outpatient Medications  Medication Sig Dispense Refill  . atorvastatin (LIPITOR) 20 MG tablet Take 20 mg by mouth daily.    . chlorthalidone (HYGROTON) 25 MG tablet     . omeprazole (PRILOSEC) 20 MG capsule Take 20 mg by mouth daily.    . ondansetron (ZOFRAN) 8 MG tablet     . Potassium Chloride ER 20 MEQ TBCR     . SUMAtriptan (IMITREX) 100 MG tablet Take 1 tablet with 600 mg of ibuprofen at the first sign of headache, may repeat in Imitrex only in 2 hours if needed.  Max 200/24hrs.     No current facility-administered medications for this visit.    REVIEW OF SYSTEMS:   A 10+ POINT REVIEW OF SYSTEMS WAS OBTAINED including neurology, dermatology, psychiatry, cardiac, respiratory, lymph, extremities, GI, GU, Musculoskeletal, constitutional, breasts, reproductive, HEENT.  All pertinent positives are noted in the HPI.  All others are negative.   PHYSICAL EXAMINATION: ECOG PERFORMANCE STATUS: 1 - Symptomatic but completely ambulatory  . There were no vitals filed for this visit. There were no vitals filed for this visit. .There is no height or weight on file to calculate BMI.  Teleheath visit  LABORATORY DATA:  I have reviewed the data as listed  . CBC Latest  Ref Rng & Units 04/02/2019 04/02/2019  WBC 4.0 - 10.5 K/uL 6.1 -  Hemoglobin 13.0 - 17.0 g/dL 14.0 -  Hematocrit 37.5 - 51.0 %  40.6 41.1  Platelets 150 - 400 K/uL 171 -    . CMP Latest Ref Rng & Units 04/02/2019  Glucose 70 - 99 mg/dL 100(H)  BUN 6 - 20 mg/dL 14  Creatinine 0.61 - 1.24 mg/dL 1.03  Sodium 135 - 145 mmol/L 139  Potassium 3.5 - 5.1 mmol/L 3.8  Chloride 98 - 111 mmol/L 99  CO2 22 - 32 mmol/L 27  Calcium 8.9 - 10.3 mg/dL 10.0  Total Protein 6.5 - 8.1 g/dL 8.5(H)  Total Bilirubin 0.3 - 1.2 mg/dL 1.8(H)  Alkaline Phos 38 - 126 U/L 55  AST 15 - 41 U/L 35  ALT 0 - 44 U/L 27     RADIOGRAPHIC STUDIES: I have personally reviewed the radiological images as listed and agreed with the findings in the report. CT Soft Tissue Neck W Contrast  Result Date: 04/10/2019 CLINICAL DATA:  Metastatic malignancy suspected. Abdominal pain with recurrent nausea and vomiting and significant weight loss. EXAM: CT NECK WITH CONTRAST TECHNIQUE: Multidetector CT imaging of the neck was performed using the standard protocol following the bolus administration of intravenous contrast. CONTRAST:  167mL OMNIPAQUE IOHEXOL 300 MG/ML  SOLN COMPARISON:  None. FINDINGS: Pharynx and larynx: No evidence of mass or inflammation Salivary glands: Symmetric prominence of submandibular ducts without obstructive process. No inflammation or stone seen. Thyroid: Normal Lymph nodes: None enlarged or abnormal density. Vascular: Prominent but not pathologic appearing and symmetric paravertebral venous plexus draining into the internal jugular veins. Limited intracranial: Negative. Visualized orbits: Negative. Mastoids and visualized paranasal sinuses: Clear. Skeleton: No acute or aggressive process.  Incidental torus palatini Upper chest: Reported separately IMPRESSION: Benign neck CT. Electronically Signed   By: Monte Fantasia M.D.   On: 04/10/2019 08:38   CT Chest W Contrast  Result Date: 04/10/2019 CLINICAL DATA:   Suspected occult primary, metastatic malignancy suspected, abdominal pain, nausea, vomiting, weight loss, anemia and thrombocytopenia, concern for lymphoma EXAM: CT CHEST, ABDOMEN, AND PELVIS WITH CONTRAST TECHNIQUE: Multidetector CT imaging of the chest, abdomen and pelvis was performed following the standard protocol during bolus administration of intravenous contrast. CONTRAST:  175mL OMNIPAQUE IOHEXOL 300 MG/ML SOLN, additional oral enteric contrast COMPARISON:  None. FINDINGS: CT CHEST FINDINGS Cardiovascular: No significant vascular findings. Normal heart size. No pericardial effusion. Mediastinum/Nodes: No enlarged mediastinal, hilar, or axillary lymph nodes. Thyroid gland, trachea, and esophagus demonstrate no significant findings. Lungs/Pleura: 4 mm benign fissural nodule of the right middle lobe abutting the minor fissure (series 6, image 77). Incidental note of accessory lingular fissure the left lung. No pleural effusion or pneumothorax. Musculoskeletal: No chest wall mass or suspicious bone lesions identified. CT ABDOMEN PELVIS FINDINGS Hepatobiliary: No solid liver abnormality is seen. Hepatic steatosis. No gallstones, gallbladder wall thickening, or biliary dilatation. Pancreas: Unremarkable. No pancreatic ductal dilatation or surrounding inflammatory changes. Spleen: Normal in size without significant abnormality. Adrenals/Urinary Tract: Adrenal glands are unremarkable. Kidneys are normal, without renal calculi, solid lesion, or hydronephrosis. Bladder is unremarkable. Stomach/Bowel: Stomach is within normal limits. Appendix appears normal. No evidence of bowel wall thickening, distention, or inflammatory changes. Descending and sigmoid diverticulosis. Vascular/Lymphatic: No significant vascular findings are present. No enlarged abdominal or pelvic lymph nodes. Reproductive: Mild prostatomegaly. Other: No abdominal wall hernia or abnormality. No abdominopelvic ascites. Musculoskeletal: Mild,  diffusely sclerotic appearance of the skeleton. IMPRESSION: 1. No CT evidence of malignancy in the chest, abdomen, or pelvis. No lymphadenopathy. 2. Hepatic steatosis. 3. Descending and sigmoid diverticulosis without evidence of diverticulitis. 4. Mild, diffusely sclerotic appearance of the  skeleton without focal lesion noted, of uncertain significance, most commonly seen with renal osteodystrophy. Electronically Signed   By: Eddie Candle M.D.   On: 04/10/2019 08:59   CT Abdomen Pelvis W Contrast  Result Date: 04/10/2019 CLINICAL DATA:  Suspected occult primary, metastatic malignancy suspected, abdominal pain, nausea, vomiting, weight loss, anemia and thrombocytopenia, concern for lymphoma EXAM: CT CHEST, ABDOMEN, AND PELVIS WITH CONTRAST TECHNIQUE: Multidetector CT imaging of the chest, abdomen and pelvis was performed following the standard protocol during bolus administration of intravenous contrast. CONTRAST:  118mL OMNIPAQUE IOHEXOL 300 MG/ML SOLN, additional oral enteric contrast COMPARISON:  None. FINDINGS: CT CHEST FINDINGS Cardiovascular: No significant vascular findings. Normal heart size. No pericardial effusion. Mediastinum/Nodes: No enlarged mediastinal, hilar, or axillary lymph nodes. Thyroid gland, trachea, and esophagus demonstrate no significant findings. Lungs/Pleura: 4 mm benign fissural nodule of the right middle lobe abutting the minor fissure (series 6, image 77). Incidental note of accessory lingular fissure the left lung. No pleural effusion or pneumothorax. Musculoskeletal: No chest wall mass or suspicious bone lesions identified. CT ABDOMEN PELVIS FINDINGS Hepatobiliary: No solid liver abnormality is seen. Hepatic steatosis. No gallstones, gallbladder wall thickening, or biliary dilatation. Pancreas: Unremarkable. No pancreatic ductal dilatation or surrounding inflammatory changes. Spleen: Normal in size without significant abnormality. Adrenals/Urinary Tract: Adrenal glands are  unremarkable. Kidneys are normal, without renal calculi, solid lesion, or hydronephrosis. Bladder is unremarkable. Stomach/Bowel: Stomach is within normal limits. Appendix appears normal. No evidence of bowel wall thickening, distention, or inflammatory changes. Descending and sigmoid diverticulosis. Vascular/Lymphatic: No significant vascular findings are present. No enlarged abdominal or pelvic lymph nodes. Reproductive: Mild prostatomegaly. Other: No abdominal wall hernia or abnormality. No abdominopelvic ascites. Musculoskeletal: Mild, diffusely sclerotic appearance of the skeleton. IMPRESSION: 1. No CT evidence of malignancy in the chest, abdomen, or pelvis. No lymphadenopathy. 2. Hepatic steatosis. 3. Descending and sigmoid diverticulosis without evidence of diverticulitis. 4. Mild, diffusely sclerotic appearance of the skeleton without focal lesion noted, of uncertain significance, most commonly seen with renal osteodystrophy. Electronically Signed   By: Eddie Candle M.D.   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 No CT evidence of malignancy in the chest, abdomen, or pelvis. No lymphadenopathy. 2. Hepatic steatosis. 3. Descending and sigmoid diverticulosis without evidence of diverticulitis. 4. Mild, diffusely sclerotic appearance of the skeleton without focal lesion noted, of uncertain significance, most commonly seen with renal osteodystrophy."  2) Mild drop in hgb with mild borderline anemia 3) significant unexplained weight loss  PLAN: -Discussed pt labwork, 04/02/19; all values are WNL except for RBC at 4.01, MCV at 102.5, MCH at 34.9, Glucose at 100, Total Protein at 8.5, Total Bilirubin at 1.8. -Discussed 04/02/2019 LDH at 171 -Discussed 04/02/2019 Copper at 102 -Discussed 04/02/2019 Haptoglobin at 43 -Discussed 04/02/2019 Vitamin B12 at  346 -Discussed 04/02/2019 Folate Hemolysate at 306.0, HCT at 40.6, Folate RBC at 754 -Discussed 04/02/2019 K/L light chains is as follows: all values are WNL except for K/L light chain ratio at 2.25 -Discussed 04/02/2019 MMP is as follows: all values are WNL except for IgG at 2133, Albumin at 4.5, M Protein at 1.4.  -Discussed 04/10/2019 CT C/A/P ([id number]) ([id number]) which revealed no concerns for malignancy or lymphadeopathy  -No signs of hemolysis, increased BM activity or lymphoma  -Pt is still macrocytic, which could be due to: nutritional deficiencies, or medication effects -  Vitamin [id number], copper and Folic Acid are in WNL -Pt has an increased M protein that is IgG Kappa specific  -Pt is not anemic, no changes in kidney function, no hypercalcemia, no bone lesions visualized on CT (does have non-specific bone changes) -Discussed conservative approach of watching with labs vs. aggressive approach of diagnostic testing  -Would recommend a BM Bx and Bone Scan to r/o a plasma cell disorder -Pt would prefer to have a Bone Scan, labs and a return visit. Not interested in a BM Bx at this time.  -Advised pt to continue f/u with Neurologist, Dr. Leonie Man -Will get Bone Scan and labs in 7 weeks -Will see back in 8 weeks via phone   FOLLOW UP: Labs in 7 weeks Bone scan in 7 weeks Phone visit with Dr Irene Limbo in 8 weeks  The total time spent in the appt was 25 minutes and more than 50% was on counseling and direct patient cares.  All of the patient's questions were answered with apparent satisfaction. The patient knows to call the clinic with any problems, questions or concerns.    [name] MD [place] AAHIVMS [facility] Hematology/Oncology Physician [facility]  (Office):       [phone] (Work cell):  [phone] (Fax):           [phone]  04/17/2019 10:16 AM  I, Yevette Edwards, am acting as a scribe for Dr. [name].   .I have reviewed the above documentation for  accuracy and completeness, and I agree with the above. Brunetta Genera MD

## 2019-04-17 ENCOUNTER — Inpatient Hospital Stay: Payer: 59 | Attending: Hematology | Admitting: Hematology

## 2019-04-17 DIAGNOSIS — R937 Abnormal findings on diagnostic imaging of other parts of musculoskeletal system: Secondary | ICD-10-CM

## 2019-04-17 DIAGNOSIS — E8809 Other disorders of plasma-protein metabolism, not elsewhere classified: Secondary | ICD-10-CM | POA: Diagnosis not present

## 2019-04-17 DIAGNOSIS — D696 Thrombocytopenia, unspecified: Secondary | ICD-10-CM | POA: Diagnosis not present

## 2019-04-18 ENCOUNTER — Telehealth: Payer: Self-pay | Admitting: Hematology

## 2019-04-18 NOTE — Telephone Encounter (Signed)
Scheduled appt  Per 12/15 los.  Spoke with pt and he is aware of the appt date and time,

## 2019-06-05 ENCOUNTER — Encounter (HOSPITAL_COMMUNITY)
Admission: RE | Admit: 2019-06-05 | Discharge: 2019-06-05 | Disposition: A | Discharge status: Home / Self Care | Payer: Managed Care, Other (non HMO) | Source: Ambulatory Visit | Attending: Hematology | Admitting: Hematology

## 2019-06-05 ENCOUNTER — Inpatient Hospital Stay: Payer: Managed Care, Other (non HMO) | Attending: Hematology

## 2019-06-05 ENCOUNTER — Other Ambulatory Visit: Payer: Self-pay

## 2019-06-05 ENCOUNTER — Ambulatory Visit (HOSPITAL_COMMUNITY)
Admission: RE | Admit: 2019-06-05 | Discharge: 2019-06-05 | Disposition: A | Discharge status: Home / Self Care | Payer: Managed Care, Other (non HMO) | Source: Ambulatory Visit | Attending: Hematology | Admitting: Hematology

## 2019-06-05 DIAGNOSIS — Z79899 Other long term (current) drug therapy: Secondary | ICD-10-CM | POA: Insufficient documentation

## 2019-06-05 DIAGNOSIS — I10 Essential (primary) hypertension: Secondary | ICD-10-CM | POA: Insufficient documentation

## 2019-06-05 DIAGNOSIS — R634 Abnormal weight loss: Secondary | ICD-10-CM | POA: Insufficient documentation

## 2019-06-05 DIAGNOSIS — D696 Thrombocytopenia, unspecified: Secondary | ICD-10-CM | POA: Insufficient documentation

## 2019-06-05 DIAGNOSIS — D509 Iron deficiency anemia, unspecified: Secondary | ICD-10-CM | POA: Insufficient documentation

## 2019-06-05 DIAGNOSIS — R937 Abnormal findings on diagnostic imaging of other parts of musculoskeletal system: Secondary | ICD-10-CM | POA: Diagnosis present

## 2019-06-05 DIAGNOSIS — G43909 Migraine, unspecified, not intractable, without status migrainosus: Secondary | ICD-10-CM | POA: Insufficient documentation

## 2019-06-05 DIAGNOSIS — E8809 Other disorders of plasma-protein metabolism, not elsewhere classified: Secondary | ICD-10-CM

## 2019-06-05 DIAGNOSIS — E785 Hyperlipidemia, unspecified: Secondary | ICD-10-CM | POA: Insufficient documentation

## 2019-06-05 DIAGNOSIS — D472 Monoclonal gammopathy: Secondary | ICD-10-CM | POA: Insufficient documentation

## 2019-06-05 LAB — CMP (CANCER CENTER ONLY)
ALT: 31 U/L (ref 0–44)
AST: 27 U/L (ref 15–41)
Albumin: 4.4 g/dL (ref 3.5–5.0)
Alkaline Phosphatase: 64 U/L (ref 38–126)
Anion gap: 8 (ref 5–15)
BUN: 10 mg/dL (ref 6–20)
CO2: 29 mmol/L (ref 22–32)
Calcium: 9.5 mg/dL (ref 8.9–10.3)
Chloride: 105 mmol/L (ref 98–111)
Creatinine: 1.11 mg/dL (ref 0.61–1.24)
GFR, Est AFR Am: >60 mL/min (ref >60)
GFR, Estimated: >60 mL/min (ref >60)
Glucose, Bld: 93 mg/dL (ref 70–99)
Potassium: 3.5 mmol/L (ref 3.5–5.1)
Sodium: 142 mmol/L (ref 135–145)
Total Bilirubin: 1.2 mg/dL (ref 0.3–1.2)
Total Protein: 8.1 g/dL (ref 6.5–8.1)

## 2019-06-05 LAB — CBC WITH DIFFERENTIAL/PLATELET
Abs Immature Granulocytes: 0 10*3/uL (ref 0.00–0.07)
Basophils Absolute: 0 10*3/uL (ref 0.0–0.1)
Basophils Relative: 1 %
Eosinophils Absolute: 0 10*3/uL (ref 0.0–0.5)
Eosinophils Relative: 1 %
HCT: 40.2 % (ref 39.0–52.0)
Hemoglobin: 13.5 g/dL (ref 13.0–17.0)
Immature Granulocytes: 0 %
Lymphocytes Relative: 28 %
Lymphs Abs: 1.2 10*3/uL (ref 0.7–4.0)
MCH: 34.3 pg — ABNORMAL HIGH (ref 26.0–34.0)
MCHC: 33.6 g/dL (ref 30.0–36.0)
MCV: 102 fL — ABNORMAL HIGH (ref 80.0–100.0)
Monocytes Absolute: 0.4 10*3/uL (ref 0.1–1.0)
Monocytes Relative: 8 %
Neutro Abs: 2.8 10*3/uL (ref 1.7–7.7)
Neutrophils Relative %: 62 %
Platelets: 168 10*3/uL (ref 150–400)
RBC: 3.94 MIL/uL — ABNORMAL LOW (ref 4.22–5.81)
RDW: 11.7 % (ref 11.5–15.5)
WBC: 4.4 10*3/uL (ref 4.0–10.5)
nRBC: 0 % (ref 0.0–0.2)

## 2019-06-05 LAB — VITAMIN D 25 HYDROXY (VIT D DEFICIENCY, FRACTURES): Vit D, 25-Hydroxy: 18.16 ng/mL — ABNORMAL LOW (ref 30–100)

## 2019-06-05 LAB — LACTATE DEHYDROGENASE: LDH: 152 U/L (ref 98–192)

## 2019-06-05 MED ORDER — TECHNETIUM TC 99M MEDRONATE IV KIT
21.0000 | PACK | Freq: Once | INTRAVENOUS | Status: AC | PRN
  Administered 2019-06-05: 21 via INTRAVENOUS

## 2019-06-06 LAB — BETA 2 MICROGLOBULIN, SERUM: Beta-2 Microglobulin: 1.4 mg/L (ref 0.6–2.4)

## 2019-06-06 LAB — KAPPA/LAMBDA LIGHT CHAINS
Kappa free light chain: 16.9 mg/L (ref 3.3–19.4)
Kappa, lambda light chain ratio: 1.92 — ABNORMAL HIGH (ref 0.26–1.65)
Lambda free light chains: 8.8 mg/L (ref 5.7–26.3)

## 2019-06-07 LAB — MULTIPLE MYELOMA PANEL, SERUM
Albumin SerPl Elph-Mcnc: 4.5 g/dL — ABNORMAL HIGH (ref 2.9–4.4)
Albumin/Glob SerPl: 1.4 (ref 0.7–1.7)
Alpha 1: 0.2 g/dL (ref 0.0–0.4)
Alpha2 Glob SerPl Elph-Mcnc: 0.5 g/dL (ref 0.4–1.0)
B-Globulin SerPl Elph-Mcnc: 0.9 g/dL (ref 0.7–1.3)
Gamma Glob SerPl Elph-Mcnc: 1.7 g/dL (ref 0.4–1.8)
Globulin, Total: 3.3 g/dL (ref 2.2–3.9)
IgA: 117 mg/dL (ref 90–386)
IgG (Immunoglobin G), Serum: 1981 mg/dL — ABNORMAL HIGH (ref 603–1613)
IgM (Immunoglobulin M), Srm: 42 mg/dL (ref 20–172)
M Protein SerPl Elph-Mcnc: 1.1 g/dL — ABNORMAL HIGH
Total Protein ELP: 7.8 g/dL (ref 6.0–8.5)

## 2019-06-11 ENCOUNTER — Telehealth: Payer: Self-pay | Admitting: Hematology

## 2019-06-12 ENCOUNTER — Inpatient Hospital Stay (HOSPITAL_BASED_OUTPATIENT_CLINIC_OR_DEPARTMENT_OTHER): Payer: Managed Care, Other (non HMO) | Admitting: Hematology

## 2019-06-12 DIAGNOSIS — D7589 Other specified diseases of blood and blood-forming organs: Secondary | ICD-10-CM

## 2019-06-12 DIAGNOSIS — D696 Thrombocytopenia, unspecified: Secondary | ICD-10-CM | POA: Diagnosis not present

## 2019-06-12 DIAGNOSIS — E8809 Other disorders of plasma-protein metabolism, not elsewhere classified: Secondary | ICD-10-CM | POA: Diagnosis not present

## 2019-06-12 DIAGNOSIS — R634 Abnormal weight loss: Secondary | ICD-10-CM | POA: Diagnosis not present

## 2019-06-12 DIAGNOSIS — G43909 Migraine, unspecified, not intractable, without status migrainosus: Secondary | ICD-10-CM | POA: Diagnosis not present

## 2019-06-12 DIAGNOSIS — I10 Essential (primary) hypertension: Secondary | ICD-10-CM | POA: Diagnosis not present

## 2019-06-12 DIAGNOSIS — D472 Monoclonal gammopathy: Secondary | ICD-10-CM | POA: Diagnosis not present

## 2019-06-12 DIAGNOSIS — Z79899 Other long term (current) drug therapy: Secondary | ICD-10-CM | POA: Diagnosis not present

## 2019-06-12 DIAGNOSIS — D509 Iron deficiency anemia, unspecified: Secondary | ICD-10-CM | POA: Diagnosis present

## 2019-06-12 DIAGNOSIS — E785 Hyperlipidemia, unspecified: Secondary | ICD-10-CM | POA: Diagnosis not present

## 2019-06-12 MED ORDER — ERGOCALCIFEROL 1.25 MG (50000 UT) PO CAPS: 50000.0000 [IU] | ORAL_CAPSULE | ORAL | 1 refills | Status: DC

## 2019-06-12 NOTE — Progress Notes (Signed)
HEMATOLOGY/ONCOLOGY CLINIC NOTE  Date of Service: 06/12/2019  Patient Care Team: London Pepper, MD as PCP - General (Family Medicine)  CHIEF COMPLAINTS/PURPOSE OF CONSULTATION:  Thrombocytopenia and developing Anemia  HISTORY OF PRESENTING ILLNESS:   Ryan Nixon is a wonderful 59 y.o. male who has been referred to [place] by Dr Orland Mustard for evaluation and management of thrombocytopenia and developing anemia. The pt reports that he is doing well overall.   The pt reports that the last time he felt like himself was in [place]. He knew that things were different when he was no longer able to play tennis, which he did so often before. Pt has been experiencing nausea, vomiting, fatigue and weight loss. He has had a 17-18 lb weight loss over the course of the last 8 months. His highest weight was around 139 lbs and his lowest was 119 lbs. Pt has had fevers accompanied by cold sweats and headaches over the last few months. His night sweats were drenching, but have since improved. This began around the same time as his other symptoms. He also notes hand peeling in both hands, over the entire palm. He has had a couple of recent episodes of memory loss. Once he wound up in [place] with no knowledge of how he got there. Pt has not had a Colonoscopy since he has turned 50 but has had an EGD, which was set up by his GI, Dr. Watt Climes. Nothing significant was found on his EGD. Dr. Watt Climes placed the pt on Omeprazole 2 weeks ago and the pt has noticed a severe decrease in his vomiting. He has been eating better and has even gained some of his weight back.   Pt has been taking his migraine medication once per week as prophylaxis and then only as needed. He has had migraine headaches for at least the last few years and does have a sensitivity to light and sound with his migraines. Pt has been on HTN medication for about 10 years and is currently on a half dose of his medication. He believes that his HTN is stable. He  denies any other new medications or symptoms. He recently had an brain MRI and has an appointment with a Neurologist tomorrow.   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 His mother passed last year and her death has had saddened him greatly. He is not sure if he is, or recently has been struggling with depression. His grandmother was diagnosed with dementia in her 63's. He drinks up to a 6 pack of beer when he plays tennis and has 3-4 glasses of wine per week. Pt does not drink much hard liquor at all. Pt has no dietary restrictions and eats at least 2 meals per day.   Of note prior to the patient's visit today, pt has had Brain MRI completed on 03/20/2019 with results revealing "1. No acute intracranial abnormality. 2. Several white matter lesions are identified, as described above. These lesions are abnormal but nonspecific, usually resulting from benign/remote/incidental causes (e.g. prior trauma/inflammation/demyelinization, or chronic ischemia associated with  migraines/atherosclerosis/other vasculopathies)."   Most recent lab results (03/15/2019) of CBC is as follows: WBC at 5.7K, RBC at 3.47, Hgb at 12.5, HCT at 36.5, MCV at 105.3, MCH at 36.0, MCHC at 34.1, RDW at 14.1, PLTs at 113K, MPV at 10.8, nRBC Abs at 0.01K, Neutro Rel at 69.4, nRBC Rel at 0.10, Lymphs Rel at 21.1, Mono Rel at 8.3, Eos Rel at 0.8, Baso Rel at  0.4, Neutro at 3.9K, Lymph Abs at 1.20K, Mono Abs at 0.5K, Eos Abs at 0.0K, Baso Abs at 0.0K, Glucose at 93, BUN at 8, Creatinine at 0.86, GFR Est AFR Am at 110, Sodium at 141, Potassium at 4.1, Chloride at 106, CO2 at 28, Anion gap at 11.8, Calcium at 9.7, Total Protein at 7.4, Albumin at 4.6, Total Bilirubin at 0.5, ALP at 49, AST at 47, ALT at 50.  03/15/2019 TSH at 1.29  On review of systems, pt reports nausea, improving vomiting, significant weight loss, fatigue, b/l hand peeling, improving night sweats. short-term memory loss and denies constipation, abdominal pain,  diarrhea, breathing changes, chest pain, new bone pain, new joint pain, pain along the spine and any other symptoms.   On PMHx the pt reports HTN, Migraines, HLD. On Social Hx the pt reports that he drinks 3-4 glasses of wine per week, up to 6 beers in 1 sitting occasionally.  On Family Hx the pt reports that his grandmother had dementia that was diagnosed in her 70's   INTERVAL HISTORY:  I connected with  Ryan Nixon on 06/12/19 by telephone and verified that I am speaking with the correct person using two identifiers.   I discussed the limitations of evaluation and management by telemedicine. The patient expressed understanding and agreed to proceed.  Other persons participating in the visit and their role in the encounter:      -Yevette Edwards, Medical Scribe  Patient's location: Home Provider's location: [place] [facility] [employer] is a wonderful 59 y.o. male who is here for evaluation and management of thrombocytopenia and developing anemia. The patient's last visit with [place] was on 04/17/2019. The pt reports that he is doing well overall.  The pt reports that he has gained 5-6 lbs, as he has been able to eat more. He still has some occasional nausea and improving fatigue. He feels about 75-80% better overall and has even started playing tennis again. Pt has been taking a daily multivitamin for about a year. Pt does eat cheese but does not drink much milk or eat yogurt.   Of note since the patient's last visit, pt has had Bone Scan Whole Body ([id number]) completed on 06/05/2019 with results revealing "Normal bone scan."  Lab results (06/05/19) of CBC w/diff and CMP is as follows: all values are WNL except for RBC at 3.94, MCV at 102.0, MCH at 34.3.  06/05/2019 LDH at 152 06/05/2019 Beta-2 Microglobulin at 1.4 06/05/2019 Vitamin D 25-hydroxy at 18.16 06/05/2019 MMP is as follows: all values are WNL except for IgG at 1981, Albumin at 4.5, M Protein at 1.1 06/05/2019 K/L light  chains is as follows: Kappa free light chain at 16.9, Lamda free light chains at 8.8, K/L light chain ratio at 1.92   On review of systems, pt reports weight gain, occasional nausea, improving fatigue and denies any other symptoms.   MEDICAL HISTORY:  Hyperlipidemia Hypertension   SURGICAL HISTORY: No known surgical history   SOCIAL HISTORY: Social History   Socioeconomic History  . Marital status: Married    Spouse name: Not on file  . Number of children: Not on file  . Years of education: Not on file  . Highest education level: Not on file  Occupational History  . Not on file  Tobacco Use  . Smoking status: Never Smoker  . Smokeless tobacco: Never Used  Substance and Sexual Activity  . Alcohol use: Yes    Alcohol/week: 2.0 standard drinks  Types: 1 Cans of beer, 1 Glasses of wine per week    Comment: occasionally  . Drug use: Not Currently  . Sexual activity: Not on file  Other Topics Concern  . Not on file  Social History Narrative  . Not on file   Social Determinants of Health   Financial Resource Strain:   . Difficulty of Paying Living Expenses: Not on file  Food Insecurity:   . Worried About Charity fundraiser in the Last Year: Not on file  . Ran Out of Food in the Last Year: Not on file  Transportation Needs:   . Lack of Transportation (Medical): Not on file  . Lack of Transportation (Non-Medical): Not on file  Physical Activity:   . Days of Exercise per Week: Not on file  . Minutes of Exercise per Session: Not on file  Stress:   . Feeling of Stress : Not on file  Social Connections:   . Frequency of Communication with Friends and Family: Not on file  . Frequency of Social Gatherings with Friends and Family: Not on file  . Attends Religious Services: Not on file  . Active Member of Clubs or Organizations: Not on file  . Attends Archivist Meetings: Not on file  . Marital Status: Not on file  Intimate Partner Violence:   . Fear of  Current or Ex-Partner: Not on file  . Emotionally Abused: Not on file  . Physically Abused: Not on file  . Sexually Abused: Not on file    FAMILY HISTORY: No family history on file.  ALLERGIES:  has no allergies on file. No known drug allergies   MEDICATIONS:  Current Outpatient Medications  Medication Sig Dispense Refill  . atorvastatin (LIPITOR) 20 MG tablet Take 20 mg by mouth daily.    . chlorthalidone (HYGROTON) 25 MG tablet     . ergocalciferol (VITAMIN D2) 1.25 MG (50000 UT) capsule Take 1 capsule (50,000 Units total) by mouth once a week. 12 capsule 1  . omeprazole (PRILOSEC) 20 MG capsule Take 20 mg by mouth daily.    . ondansetron (ZOFRAN) 8 MG tablet     . Potassium Chloride ER 20 MEQ TBCR     . SUMAtriptan (IMITREX) 100 MG tablet Take 1 tablet with 600 mg of ibuprofen at the first sign of headache, may repeat in Imitrex only in 2 hours if needed.  Max 200/24hrs.     No current facility-administered medications for this visit.    REVIEW OF SYSTEMS:   A 10+ POINT REVIEW OF SYSTEMS WAS OBTAINED including neurology, dermatology, psychiatry, cardiac, respiratory, lymph, extremities, GI, GU, Musculoskeletal, constitutional, breasts, reproductive, HEENT.  All pertinent positives are noted in the HPI.  All others are negative.   PHYSICAL EXAMINATION: ECOG PERFORMANCE STATUS: 1 - Symptomatic but completely ambulatory  . There were no vitals filed for this visit. There were no vitals filed for this visit. .There is no height or weight on file to calculate BMI.  Telehealth visit  LABORATORY DATA:  I have reviewed the data as listed  . CBC Latest Ref Rng & Units 06/05/2019 04/02/2019 04/02/2019  WBC 4.0 - 10.5 K/uL 4.4 6.1 -  Hemoglobin 13.0 - 17.0 g/dL 13.5 14.0 -  Hematocrit 39.0 - 52.0 % 40.2 40.6 41.1  Platelets 150 - 400 K/uL 168 171 -    . CMP Latest Ref Rng & Units 06/05/2019 04/02/2019  Glucose 70 - 99 mg/dL 93 100(H)  BUN 6 - 20 mg/dL 10  14  Creatinine 0.61  - 1.24 mg/dL 1.11 1.03  Sodium 135 - 145 mmol/L 142 139  Potassium 3.5 - 5.1 mmol/L 3.5 3.8  Chloride 98 - 111 mmol/L 105 99  CO2 22 - 32 mmol/L 29 27  Calcium 8.9 - 10.3 mg/dL 9.5 10.0  Total Protein 6.5 - 8.1 g/dL 8.1 8.5(H)  Total Bilirubin 0.3 - 1.2 mg/dL 1.2 1.8(H)  Alkaline Phos 38 - 126 U/L 64 55  AST 15 - 41 U/L 27 35  ALT 0 - 44 U/L 31 27     RADIOGRAPHIC STUDIES: I have personally reviewed the radiological images as listed and agreed with the findings in the report. NM Bone Scan Whole Body  Result Date: 06/06/2019 CLINICAL DATA:  Monoclonal paraproteinemia with nonspecific bone changes on CT, possible myeloma EXAM: NUCLEAR MEDICINE WHOLE BODY BONE SCAN TECHNIQUE: Whole body anterior and posterior images were obtained approximately 3 hours after intravenous injection of radiopharmaceutical. RADIOPHARMACEUTICALS:  21.0 mCi Technetium-2mMDP IV COMPARISON:  None Correlation: CT chest abdomen pelvis 04/10/2019 FINDINGS: Normal distribution of tracer within the axial and appendicular skeleton. No abnormal sites of tracer uptake are identified to suggest osseous metastatic disease. Expected urinary tract and soft tissue distribution of tracer. IMPRESSION: Normal bone scan. Please note that purely lytic processes such as multiple myeloma and purely lytic metastases are typically occult by bone scintigraphy and better assessed by radiographic metastatic bone survey. Electronically Signed   By: MLavonia DanaM.D.   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 No CT evidence of malignancy in the chest, abdomen, or pelvis. No lymphadenopathy. 2. Hepatic steatosis. 3. Descending and sigmoid diverticulosis without evidence of diverticulitis. 4. Mild, diffusely sclerotic appearance of the skeleton without focal lesion noted, of uncertain significance, most  commonly seen with renal osteodystrophy."  2) Mild drop in hgb with mild borderline anemia 3) significant unexplained weight loss  PLAN: -Discussed pt labwork, 06/05/19; some macrocytosis, other blood counts and chemistries are nml  -Discussed 06/05/2019 LDH is WNL at 152 -Discussed 06/05/2019 Beta-2 Microglobulin is WNL at 1.4 -Discussed 06/05/2019 Vitamin D 25-hydroxy is low at 18.16 -Discussed 06/05/2019 MMP shows M protein down to 1.1 g/dL from 1.4 g/dL  -Discussed 06/05/2019 K/L light chains is as follows: Kappa free light chain at 16.9, Lamda free light chains at 8.8, K/L light chain ratio at 1.92 -Discussed 06/05/2019 Bone Scan Whole Body ([id number] which revealed "Normal bone scan." -Improvement of clinical symptoms -Appears to be a reactive process, as opposed to a clonal process due to improving M Protein  -No indication for BM Bx at this time -Recommend pt take 50000 IU Vitamin D/Ergocalciferol once per week -Recommend pt increase dietary intake of Calcium or use a Calcium supplement -Rx 50000 IU Vitamin D -Will see back in 6 months with labs via phone, with labs 1 week prior -Pt advised to contact if any changes in symptomology   FOLLOW UP: Phone visit with labs in 6 months. Plz schedule labs 1 week prior to phone visit   The total time spent in the appt was 20 minutes and more than 50% was on counseling and direct patient cares.  All of the patient's questions were answered with apparent satisfaction. The patient knows to call the clinic with any problems, questions or concerns.    GSullivan LoneMD MS AAHIVMS [place]  Hematology/Oncology Physician [facility]  (Office):       [phone] (Work cell):  [phone] (Fax):           [phone]  06/12/2019 3:28 PM  I, Yevette Edwards, am acting as a scribe for Dr. [name].   .I have reviewed the above documentation for accuracy and completeness, and I agree with the above. Brunetta Genera  MD

## 2019-06-14 ENCOUNTER — Telehealth: Payer: Self-pay | Admitting: Hematology

## 2019-06-14 NOTE — Telephone Encounter (Signed)
Scheduled per 02/09 los, patient has been called and notified.  °

## 2019-07-09 ENCOUNTER — Ambulatory Visit: Payer: 59 | Admitting: Neurology

## 2019-07-09 ENCOUNTER — Other Ambulatory Visit: Payer: Self-pay

## 2019-07-09 ENCOUNTER — Encounter: Payer: Self-pay | Admitting: Neurology

## 2019-07-09 VITALS — BP 129/89 | HR 68 | Temp 97.6°F | Ht 67.0 in | Wt 135.0 lb

## 2019-07-09 DIAGNOSIS — G3184 Mild cognitive impairment, so stated: Secondary | ICD-10-CM

## 2019-07-09 DIAGNOSIS — E7211 Homocystinuria: Secondary | ICD-10-CM | POA: Diagnosis not present

## 2019-07-09 DIAGNOSIS — G43009 Migraine without aura, not intractable, without status migrainosus: Secondary | ICD-10-CM

## 2019-07-09 NOTE — Patient Instructions (Signed)
I had a long discussion with the patient regarding his migraine headaches as well as mild cognitive impairment both of which appear to be stable.  I recommend he continue to use Imitrex for symptomatic relief and avoid migraine triggers.  He was also advised to take folic acid 1 mg daily for his elevated homocystine and increase participation in activities which are cognitively challenging like solving crossword puzzles, playing bridge and sudoku.  No routine scheduled follow-up appointment with me is necessary but he may call if he has new complaints.

## 2019-07-09 NOTE — Progress Notes (Signed)
[facility] [address] [place]. [place] [address] [phone]       OFFICE FOLLOW UP VISIT NOTE  [name] Date of Birth:  1961-02-19 Medical Record Number:  [id number]   Referring MD: London Pepper  Reason for Referral: Migraines and disorientation  HPI: Initial consult 04/03/2019 [name] is a pleasant 59 year old African-American male with past medical history of migraines, hyperlipidemia and recent diagnosis of anemia and thrombocytopenia and determine etiology.  History is obtained from him and review of referral notes and electronic medical records. Patient states he has a 10-year history of migraine headaches which of the common type without any vision symptoms or orals.  The headache begins with a pulsating vein of the left temple followed by initially mild headache which if it does not take anything goes on to become a moderate to severe headache which may last for hours.  There is some nausea and occasional vomiting.  There is light and sound sensitivity.  He needs to lie down and stop what he is doing.  He used to usually take ibuprofen 600 mg at the onset if this did not relieve he would take Imitrex which would work quite well.  Patient recently started having more nausea vomiting and saw his primary care physician who ordered some tests he was found to have thrombocytopenia and he has been referred to oncology and was seen yesterday and has had lots of lab work done the results of which are not yet back.  He was also found to have mild anemia.  Patient states his migraine headaches are more or less fairly steady occur once or twice a month at the most.  He has never been on prophylactic drugs.  Imitrex seems to work quite well.  He denies any focal neurological symptoms accompanying his migraines.  However he had an episode a month ago when he was driving he is lost track of time for short.Marland Kitchen  He found himself in the wrist area and had temporarily lost his  bearings and did not know where he was.  He had to call somebody to figure out where he was and subsequently he was able to drive without problem.  On inquiry he admits to having noted some short-term memory difficulties this year.  He has trouble remembering recent information and appointments at times.  Is never gotten lost.  Is still independent and with activities of daily living.  He denies any worsening headaches, gait or balance problems.  There is no history of seizures, significant loss of consciousness or head injury.  There is no family history of dementia or seizures.  Patient states he had MRI scan of the brain done 2 weeks ago at [facility] but I do not have either the films of the reports but the patient informs me that it was normal. Update 07/09/2019 : He returns for follow-up after last visit 04/03/2019.  He states is doing well.  He states his migraines are doing well he has had only about 2 migraine headaches per month and they respond quite well to Imitrex.  He is unable to identify specific triggers except possibly weather change.  He had lab work done at last visit on 04/03/2019 which included vitamin B12, TSH which were normal.  Homocystine was slightly elevated at 17.4.  He is start taking folic acid over-the-counter for this.  EEG was also done which was normal.  He saw hematologist Dr. Barrie Lyme for his IgM paraproteinemia and mild anemia which is  now being followed conservatively and he has a follow-up appointment in 6 months.  He had of several days when he had a peculiar smell in his house but that seems to have resolved after his wife changed the laundry detergent.  He has no other episodes or history of seizures altered sensorium.  He has not been participating in regular cognitively challenging activities but states he will start to do so from now on. ROS:   14 system review of systems is positive for memory loss, confusion, disorientation, headache and all other systems  negative  PMH:  Past Medical History:  Diagnosis Date  . Headache     Social History:  Social History   Socioeconomic History  . Marital status: Married    Spouse name: Not on file  . Number of children: Not on file  . Years of education: Not on file  . Highest education level: Not on file  Occupational History  . Not on file  Tobacco Use  . Smoking status: Never Smoker  . Smokeless tobacco: Never Used  Substance and Sexual Activity  . Alcohol use: Yes    Alcohol/week: 2.0 standard drinks    Types: 1 Cans of beer, 1 Glasses of wine per week    Comment: occasionally  . Drug use: Not Currently  . Sexual activity: Not on file  Other Topics Concern  . Not on file  Social History Narrative  . Not on file   Social Determinants of Health   Financial Resource Strain:   . Difficulty of Paying Living Expenses: Not on file  Food Insecurity:   . Worried About Charity fundraiser in the Last Year: Not on file  . Ran Out of Food in the Last Year: Not on file  Transportation Needs:   . Lack of Transportation (Medical): Not on file  . Lack of Transportation (Non-Medical): Not on file  Physical Activity:   . Days of Exercise per Week: Not on file  . Minutes of Exercise per Session: Not on file  Stress:   . Feeling of Stress : Not on file  Social Connections:   . Frequency of Communication with Friends and Family: Not on file  . Frequency of Social Gatherings with Friends and Family: Not on file  . Attends Religious Services: Not on file  . Active Member of Clubs or Organizations: Not on file  . Attends Archivist Meetings: Not on file  . Marital Status: Not on file  Intimate Partner Violence:   . Fear of Current or Ex-Partner: Not on file  . Emotionally Abused: Not on file  . Physically Abused: Not on file  . Sexually Abused: Not on file    Medications:   Current Outpatient Medications on File Prior to Visit  Medication Sig Dispense Refill  . atorvastatin  (LIPITOR) 20 MG tablet Take 20 mg by mouth daily.    . chlorthalidone (HYGROTON) 25 MG tablet Take  1/2    . ergocalciferol (VITAMIN D2) 1.25 MG (50000 UT) capsule Take 1 capsule (50,000 Units total) by mouth once a week. 12 capsule 1  . omeprazole (PRILOSEC) 20 MG capsule Take 20 mg by mouth daily.    . ondansetron (ZOFRAN) 8 MG tablet     . Potassium Chloride ER 20 MEQ TBCR     . SUMAtriptan (IMITREX) 100 MG tablet Take 1 tablet with 600 mg of ibuprofen at the first sign of headache, may repeat in Imitrex only in 2 hours if needed.  Max 200/24hrs.     No current facility-administered medications on file prior to visit.    Allergies:  Not on File  Physical Exam General: Frail middle-aged African-American male seated, in no evident distress Head: head normocephalic and atraumatic.   Neck: supple with no carotid or supraclavicular bruits Cardiovascular: regular rate and rhythm, no murmurs Musculoskeletal: no deformity Skin:  no rash/petichiae Vascular:  Normal pulses all extremities  Neurologic Exam Mental Status: Awake and fully alert. Oriented to place and time. Recent and remote memory intact. Attention span, concentration and fund of knowledge appropriate. Mood and affect appropriate.  Diminished recall 0/3.  Able to name 12 animals which can walk on 4 legs.  Does not appear depressed. Cranial Nerves: Fundoscopic exam reveals sharp disc margins. Pupils equal, briskly reactive to light. Extraocular movements full without nystagmus. Visual fields full to confrontation. Hearing intact. Facial sensation intact. Face, tongue, palate moves normally and symmetrically.  Motor: Normal bulk and tone. Normal strength in all tested extremity muscles. Sensory.: intact to touch , pinprick , position and vibratory sensation.  Coordination: Rapid alternating movements normal in all extremities. Finger-to-nose and heel-to-shin performed accurately bilaterally. Gait and Station: Arises from chair  without difficulty. Stance is normal. Gait demonstrates normal stride length and balance . Able to heel, toe and tandem walk without difficulty.  Reflexes: 1+ and symmetric. Toes downgoing.       ASSESSMENT: 59 year old male with longstanding history of migraines which appears stable as well as mild cognitive impairment and one transient episode of disorientation of unclear etiology.      PLAN: I had a long discussion with the patient regarding his migraine headaches as well as mild cognitive impairment both of which appear to be stable.  I recommend he continue to use Imitrex for symptomatic relief and avoid migraine triggers.  He was also advised to take folic acid 1 mg daily for his elevated homocystine and increase participation in activities which are cognitively challenging like solving crossword puzzles, playing bridge and sudoku.  No routine scheduled follow-up appointment with me is necessary but he may call if he has new complaints.  Greater than 50% time during this 30-minute  visit was spent on counseling and coordination of care about his episode of transient disorientation, short-term memory difficulties and migraines and answering questions.  I also advised memory compensation strategies.  Antony Contras, MD  [facility] [address] [place], [place] [address]  Phone [phone] Fax [phone] Note: This document was prepared with digital dictation and possible smart phrase technology. Any transcriptional errors that result from this process are unintentional.

## 2019-11-27 ENCOUNTER — Other Ambulatory Visit: Payer: Self-pay | Admitting: Hematology

## 2019-12-10 ENCOUNTER — Inpatient Hospital Stay: Payer: 59

## 2019-12-10 ENCOUNTER — Telehealth: Payer: Self-pay | Admitting: Hematology

## 2019-12-10 NOTE — Telephone Encounter (Signed)
R/s 8/9 appt per patient request. Called and spoke with patient. Confirmed new date and time

## 2019-12-12 ENCOUNTER — Inpatient Hospital Stay: Payer: 59 | Attending: Hematology

## 2019-12-12 ENCOUNTER — Other Ambulatory Visit: Payer: Self-pay

## 2019-12-12 DIAGNOSIS — Z79899 Other long term (current) drug therapy: Secondary | ICD-10-CM | POA: Diagnosis not present

## 2019-12-12 DIAGNOSIS — D7589 Other specified diseases of blood and blood-forming organs: Secondary | ICD-10-CM

## 2019-12-12 DIAGNOSIS — D472 Monoclonal gammopathy: Secondary | ICD-10-CM | POA: Diagnosis not present

## 2019-12-12 DIAGNOSIS — E8809 Other disorders of plasma-protein metabolism, not elsewhere classified: Secondary | ICD-10-CM

## 2019-12-12 DIAGNOSIS — D649 Anemia, unspecified: Secondary | ICD-10-CM | POA: Diagnosis not present

## 2019-12-12 DIAGNOSIS — D696 Thrombocytopenia, unspecified: Secondary | ICD-10-CM | POA: Diagnosis not present

## 2019-12-12 DIAGNOSIS — R634 Abnormal weight loss: Secondary | ICD-10-CM | POA: Diagnosis not present

## 2019-12-12 LAB — CBC WITH DIFFERENTIAL/PLATELET
Abs Immature Granulocytes: 0.01 10*3/uL (ref 0.00–0.07)
Basophils Absolute: 0 10*3/uL (ref 0.0–0.1)
Basophils Relative: 0 %
Eosinophils Absolute: 0.1 10*3/uL (ref 0.0–0.5)
Eosinophils Relative: 1 %
HCT: 39.2 % (ref 39.0–52.0)
Hemoglobin: 13.1 g/dL (ref 13.0–17.0)
Immature Granulocytes: 0 %
Lymphocytes Relative: 38 %
Lymphs Abs: 1.9 10*3/uL (ref 0.7–4.0)
MCH: 33.6 pg (ref 26.0–34.0)
MCHC: 33.4 g/dL (ref 30.0–36.0)
MCV: 100.5 fL — ABNORMAL HIGH (ref 80.0–100.0)
Monocytes Absolute: 0.4 10*3/uL (ref 0.1–1.0)
Monocytes Relative: 8 %
Neutro Abs: 2.6 10*3/uL (ref 1.7–7.7)
Neutrophils Relative %: 53 %
Platelets: 164 10*3/uL (ref 150–400)
RBC: 3.9 MIL/uL — ABNORMAL LOW (ref 4.22–5.81)
RDW: 11.9 % (ref 11.5–15.5)
WBC: 4.9 10*3/uL (ref 4.0–10.5)
nRBC: 0 % (ref 0.0–0.2)

## 2019-12-12 LAB — CMP (CANCER CENTER ONLY)
ALT: 34 U/L (ref 0–44)
AST: 44 U/L — ABNORMAL HIGH (ref 15–41)
Albumin: 4.1 g/dL (ref 3.5–5.0)
Alkaline Phosphatase: 73 U/L (ref 38–126)
Anion gap: 8 (ref 5–15)
BUN: 6 mg/dL (ref 6–20)
CO2: 29 mmol/L (ref 22–32)
Calcium: 9.9 mg/dL (ref 8.9–10.3)
Chloride: 103 mmol/L (ref 98–111)
Creatinine: 1.16 mg/dL (ref 0.61–1.24)
GFR, Est AFR Am: >60 mL/min (ref >60)
GFR, Estimated: >60 mL/min (ref >60)
Glucose, Bld: 84 mg/dL (ref 70–99)
Potassium: 4 mmol/L (ref 3.5–5.1)
Sodium: 140 mmol/L (ref 135–145)
Total Bilirubin: 1 mg/dL (ref 0.3–1.2)
Total Protein: 8 g/dL (ref 6.5–8.1)

## 2019-12-14 LAB — MULTIPLE MYELOMA PANEL, SERUM
Albumin SerPl Elph-Mcnc: 3.8 g/dL (ref 2.9–4.4)
Albumin/Glob SerPl: 1.1 (ref 0.7–1.7)
Alpha 1: 0.1 g/dL (ref 0.0–0.4)
Alpha2 Glob SerPl Elph-Mcnc: 0.5 g/dL (ref 0.4–1.0)
B-Globulin SerPl Elph-Mcnc: 0.9 g/dL (ref 0.7–1.3)
Gamma Glob SerPl Elph-Mcnc: 2 g/dL — ABNORMAL HIGH (ref 0.4–1.8)
Globulin, Total: 3.6 g/dL (ref 2.2–3.9)
IgA: 129 mg/dL (ref 90–386)
IgG (Immunoglobin G), Serum: 1832 mg/dL — ABNORMAL HIGH (ref 603–1613)
IgM (Immunoglobulin M), Srm: 46 mg/dL (ref 20–172)
M Protein SerPl Elph-Mcnc: 1.5 g/dL — ABNORMAL HIGH
Total Protein ELP: 7.4 g/dL (ref 6.0–8.5)

## 2019-12-16 NOTE — Progress Notes (Signed)
HEMATOLOGY/ONCOLOGY CLINIC NOTE  Date of Service: 12/17/2019  Patient Care Team: London Pepper, MD as PCP - General (Family Medicine)  CHIEF COMPLAINTS/PURPOSE OF CONSULTATION:  Thrombocytopenia and developing Anemia  HISTORY OF PRESENTING ILLNESS:   Ryan Nixon is a wonderful 59 y.o. male who has been referred to [place] by Dr Orland Mustard for evaluation and management of thrombocytopenia and developing anemia. The pt reports that he is doing well overall.   The pt reports that the last time he felt like himself was in [place]. He knew that things were different when he was no longer able to play tennis, which he did so often before. Pt has been experiencing nausea, vomiting, fatigue and weight loss. He has had a 17-18 lb weight loss over the course of the last 8 months. His highest weight was around 139 lbs and his lowest was 119 lbs. Pt has had fevers accompanied by cold sweats and headaches over the last few months. His night sweats were drenching, but have since improved. This began around the same time as his other symptoms. He also notes hand peeling in both hands, over the entire palm. He has had a couple of recent episodes of memory loss. Once he wound up in [place] with no knowledge of how he got there. Pt has not had a Colonoscopy since he has turned 31 but has had an EGD, which was set up by his GI, Dr. Watt Climes. Nothing significant was found on his EGD. Dr. Watt Climes placed the pt on Omeprazole 2 weeks ago and the pt has noticed a severe decrease in his vomiting. He has been eating better and has even gained some of his weight back.   Pt has been taking his migraine medication once per week as prophylaxis and then only as needed. He has had migraine headaches for at least the last few years and does have a sensitivity to light and sound with his migraines. Pt has been on HTN medication for about 10 years and is currently on a half dose of his medication. He believes that his HTN is stable.  He denies any other new medications or symptoms. He recently had an brain MRI and has an appointment with a Neurologist tomorrow.   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 His mother passed last year and her death has had saddened him greatly. He is not sure if he is, or recently has been struggling with depression. His grandmother was diagnosed with dementia in her 93's. He drinks up to a 6 pack of beer when he plays tennis and has 3-4 glasses of wine per week. Pt does not drink much hard liquor at all. Pt has no dietary restrictions and eats at least 2 meals per day.   Of note prior to the patient's visit today, pt has had Brain MRI completed on 03/20/2019 with results revealing "1. No acute intracranial abnormality. 2. Several white matter lesions are identified, as described above. These lesions are abnormal but nonspecific, usually resulting from benign/remote/incidental causes (e.g. prior trauma/inflammation/demyelinization, or chronic ischemia associated with  migraines/atherosclerosis/other vasculopathies)."   Most recent lab results (03/15/2019) of CBC is as follows: WBC at 5.7K, RBC at 3.47, Hgb at 12.5, HCT at 36.5, MCV at 105.3, MCH at 36.0, MCHC at 34.1, RDW at 14.1, PLTs at 113K, MPV at 10.8, nRBC Abs at 0.01K, Neutro Rel at 69.4, nRBC Rel at 0.10, Lymphs Rel at 21.1, Mono Rel at 8.3, Eos Rel at 0.8, Baso Rel at  0.4, Neutro at 3.9K, Lymph Abs at 1.20K, Mono Abs at 0.5K, Eos Abs at 0.0K, Baso Abs at 0.0K, Glucose at 93, BUN at 8, Creatinine at 0.86, GFR Est AFR Am at 110, Sodium at 141, Potassium at 4.1, Chloride at 106, CO2 at 28, Anion gap at 11.8, Calcium at 9.7, Total Protein at 7.4, Albumin at 4.6, Total Bilirubin at 0.5, ALP at 49, AST at 47, ALT at 50.  03/15/2019 TSH at 1.29  On review of systems, pt reports nausea, improving vomiting, significant weight loss, fatigue, b/l hand peeling, improving night sweats. short-term memory loss and denies constipation, abdominal pain,  diarrhea, breathing changes, chest pain, new bone pain, new joint pain, pain along the spine and any other symptoms.   On PMHx the pt reports HTN, Migraines, HLD. On Social Hx the pt reports that he drinks 3-4 glasses of wine per week, up to 6 beers in 1 sitting occasionally.  On Family Hx the pt reports that his grandmother had dementia that was diagnosed in her 61's   INTERVAL HISTORY:  I connected with  Ryan Nixon on 12/17/19 by telephone and verified that I am speaking with the correct person using two identifiers.   I discussed the limitations of evaluation and management by telemedicine. The patient expressed understanding and agreed to proceed.  Other persons participating in the visit and their role in the encounter:       -Yevette Edwards, Medical Scribe  Patient's location: Home Provider's location: [facility] [employer] is a wonderful 59 y.o. male who is here for evaluation and management of thrombocytopenia and developing anemia. The patient's last visit with [place] was on 06/12/2019. The pt reports that he is doing well overall.  The pt reports that he is eating well and believes that he is gaining weight. He feels that he is 85% back to his normal.  Lab results (12/12/19) of CBC w/diff and CMP is as follows: all values are WNL except for RBC at 3.90, MCV at 100.5, AST at 44. 12/12/2019 MMP is as follows: all values are WNL except for IgG at 1832, Gamma Glob at 2.0, M Protein at 1.5.  On review of systems, pt denies low appetite, unexpected weight loss any other symptoms.    MEDICAL HISTORY:  Hyperlipidemia Hypertension   SURGICAL HISTORY: No known surgical history   SOCIAL HISTORY: Social History   Socioeconomic History  . Marital status: Married    Spouse name: Not on file  . Number of children: Not on file  . Years of education: Not on file  . Highest education level: Not on file  Occupational History  . Not on file  Tobacco Use  . Smoking  status: Never Smoker  . Smokeless tobacco: Never Used  Substance and Sexual Activity  . Alcohol use: Yes    Alcohol/week: 2.0 standard drinks    Types: 1 Cans of beer, 1 Glasses of wine per week    Comment: occasionally  . Drug use: Not Currently  . Sexual activity: Not on file  Other Topics Concern  . Not on file  Social History Narrative  . Not on file   Social Determinants of Health   Financial Resource Strain:   . Difficulty of Paying Living Expenses:   Food Insecurity:   . Worried About Charity fundraiser in the Last Year:   . Arboriculturist in the Last Year:   Transportation Needs:   . Film/video editor (Medical):   Marland Kitchen  Lack of Transportation (Non-Medical):   Physical Activity:   . Days of Exercise per Week:   . Minutes of Exercise per Session:   Stress:   . Feeling of Stress :   Social Connections:   . Frequency of Communication with Friends and Family:   . Frequency of Social Gatherings with Friends and Family:   . Attends Religious Services:   . Active Member of Clubs or Organizations:   . Attends Archivist Meetings:   Marland Kitchen Marital Status:   Intimate Partner Violence:   . Fear of Current or Ex-Partner:   . Emotionally Abused:   Marland Kitchen Physically Abused:   . Sexually Abused:     FAMILY HISTORY: No family history on file.  ALLERGIES:  has no allergies on file. No known drug allergies   MEDICATIONS:  Current Outpatient Medications  Medication Sig Dispense Refill  . atorvastatin (LIPITOR) 20 MG tablet Take 20 mg by mouth daily.    . chlorthalidone (HYGROTON) 25 MG tablet Take  1/2    . omeprazole (PRILOSEC) 20 MG capsule Take 20 mg by mouth daily.    . ondansetron (ZOFRAN) 8 MG tablet     . Potassium Chloride ER 20 MEQ TBCR     . SUMAtriptan (IMITREX) 100 MG tablet Take 1 tablet with 600 mg of ibuprofen at the first sign of headache, may repeat in Imitrex only in 2 hours if needed.  Max 200/24hrs.    . Vitamin D, Ergocalciferol, (DRISDOL) 1.25 MG  (50000 UNIT) CAPS capsule TAKE 1 CAPSULE BY MOUTH ONE TIME PER WEEK 12 capsule 0   No current facility-administered medications for this visit.    REVIEW OF SYSTEMS:   A 10+ POINT REVIEW OF SYSTEMS WAS OBTAINED including neurology, dermatology, psychiatry, cardiac, respiratory, lymph, extremities, GI, GU, Musculoskeletal, constitutional, breasts, reproductive, HEENT.  All pertinent positives are noted in the HPI.  All others are negative.   PHYSICAL EXAMINATION: ECOG PERFORMANCE STATUS: 1 - Symptomatic but completely ambulatory  Telehealth visit  LABORATORY DATA:  I have reviewed the data as listed  . CBC Latest Ref Rng & Units 12/12/2019 06/05/2019 04/02/2019  WBC 4.0 - 10.5 K/uL 4.9 4.4 6.1  Hemoglobin 13.0 - 17.0 g/dL 13.1 13.5 14.0  Hematocrit 39 - 52 % 39.2 40.2 40.6  Platelets 150 - 400 K/uL 164 168 171    . CMP Latest Ref Rng & Units 12/12/2019 06/05/2019 04/02/2019  Glucose 70 - 99 mg/dL 84 93 100(H)  BUN 6 - 20 mg/dL _0 Creatinine 0.61 - 1.24 mg/dL 1.16 1.11 1.03  Sodium 135 - 145 mmol/L 140 142 139  Potassium 3.5 - 5.1 mmol/L 4.0 3.5 3.8  Chloride 98 - 111 mmol/L 103 105 99  CO2 22 - 32 mmol/L _1 Calcium 8.9 - 10.3 mg/dL 9.9 9.5 10.0  Total Protein 6.5 - 8.1 g/dL 8.0 8.1 8.5(H)  Total Bilirubin 0.3 - 1.2 mg/dL 1.0 1.2 1.8(H)  Alkaline Phos 38 - 126 U/L 73 64 55  AST 15 - 41 U/L 44(H) 27 35  ALT 0 - 44 U/L 34 31 27     RADIOGRAPHIC STUDIES: I have personally reviewed the radiological images as listed and agreed with the findings in the report. No results found.  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 No CT evidence of malignancy in the chest, abdomen, or  pelvis. No lymphadenopathy. 2. Hepatic steatosis. 3. Descending and sigmoid diverticulosis without evidence of diverticulitis. 4. Mild, diffusely sclerotic appearance of the  skeleton without focal lesion noted, of uncertain significance, most commonly seen with renal osteodystrophy."  -06/05/2019 Bone Scan Whole Body ([id number]) revealed "Normal bone scan." 2) Mild drop in hgb with mild borderline anemia 3) significant unexplained weight loss  PLAN: -Discussed pt labwork today, 12/17/19; blood counts are stable, blood chemistries are nml, M Protein back up to 1.5 g/dL -Discussed CRAB criteria: no hypercalcemia, no renal dysfunction, no anemia, no bone lesions identified.  -Advised pt that although he has no symptoms of active disease he could have Smoldering Multiple Myeloma based on increased M Protein. -Discussed conservative approach of watching with labs in 6 months vs aggressive approach of completing a w/o including a BM Bx -Pt would prefer to complete the w/o -Continue 50000 IU Vitamin D/Ergocalciferol weekly -Will get CT guided BM Bx & labs in 12 weeks -Will see back in 14 weeks    FOLLOW UP: Labs in 12 weeks CT bone marrow aspiration and biopsy in 12 weeks RTC with Dr Irene Limbo in 14 weeks   The total time spent in the appt was 20 minutes and more than 50% was on counseling and direct patient cares.  All of the patient's questions were answered with apparent satisfaction. The patient knows to call the clinic with any problems, questions or concerns.    [name] MD [place] AAHIVMS [facility] Hematology/Oncology Physician [facility]  (Office):       [phone] (Work cell):  [phone] (Fax):           [phone]  12/17/2019 11:27 AM  I, Yevette Edwards, am acting as a scribe for Dr. [name].   .I have reviewed the above documentation for accuracy and completeness, and I agree with the above. Brunetta Genera MD

## 2019-12-17 ENCOUNTER — Telehealth: Payer: Self-pay | Admitting: Hematology

## 2019-12-17 ENCOUNTER — Inpatient Hospital Stay (HOSPITAL_BASED_OUTPATIENT_CLINIC_OR_DEPARTMENT_OTHER): Payer: 59 | Admitting: Hematology

## 2019-12-17 DIAGNOSIS — E8809 Other disorders of plasma-protein metabolism, not elsewhere classified: Secondary | ICD-10-CM | POA: Diagnosis not present

## 2019-12-17 DIAGNOSIS — D696 Thrombocytopenia, unspecified: Secondary | ICD-10-CM | POA: Diagnosis not present

## 2019-12-17 NOTE — Telephone Encounter (Signed)
Scheduled per 08/16 los, patient has been called and voicemail was left. 

## 2020-03-07 ENCOUNTER — Other Ambulatory Visit: Payer: Self-pay | Admitting: Radiology

## 2020-03-10 ENCOUNTER — Encounter (HOSPITAL_COMMUNITY): Payer: Self-pay

## 2020-03-10 ENCOUNTER — Other Ambulatory Visit: Payer: Self-pay

## 2020-03-10 ENCOUNTER — Ambulatory Visit (HOSPITAL_COMMUNITY)
Admission: RE | Admit: 2020-03-10 | Discharge: 2020-03-10 | Disposition: A | Discharge status: Home / Self Care | Payer: 59 | Source: Ambulatory Visit | Attending: Family Medicine | Admitting: Family Medicine

## 2020-03-10 ENCOUNTER — Inpatient Hospital Stay: Payer: 59 | Attending: Hematology

## 2020-03-10 ENCOUNTER — Other Ambulatory Visit: Payer: 59

## 2020-03-10 ENCOUNTER — Ambulatory Visit (HOSPITAL_COMMUNITY)
Admission: RE | Admit: 2020-03-10 | Discharge: 2020-03-10 | Disposition: A | Discharge status: Home / Self Care | Payer: 59 | Source: Ambulatory Visit | Attending: Hematology | Admitting: Hematology

## 2020-03-10 DIAGNOSIS — D472 Monoclonal gammopathy: Secondary | ICD-10-CM | POA: Diagnosis present

## 2020-03-10 DIAGNOSIS — D649 Anemia, unspecified: Secondary | ICD-10-CM | POA: Insufficient documentation

## 2020-03-10 DIAGNOSIS — Z79899 Other long term (current) drug therapy: Secondary | ICD-10-CM | POA: Insufficient documentation

## 2020-03-10 DIAGNOSIS — C9030 Solitary plasmacytoma not having achieved remission: Secondary | ICD-10-CM | POA: Diagnosis not present

## 2020-03-10 DIAGNOSIS — E8809 Other disorders of plasma-protein metabolism, not elsewhere classified: Secondary | ICD-10-CM

## 2020-03-10 LAB — CBC WITH DIFFERENTIAL/PLATELET
Abs Immature Granulocytes: 0.01 10*3/uL (ref 0.00–0.07)
Abs Immature Granulocytes: 0.01 10*3/uL (ref 0.00–0.07)
Basophils Absolute: 0 10*3/uL (ref 0.0–0.1)
Basophils Absolute: 0 10*3/uL (ref 0.0–0.1)
Basophils Relative: 1 %
Basophils Relative: 1 %
Eosinophils Absolute: 0 10*3/uL (ref 0.0–0.5)
Eosinophils Absolute: 0 10*3/uL (ref 0.0–0.5)
Eosinophils Relative: 1 %
Eosinophils Relative: 1 %
HCT: 40.1 % (ref 39.0–52.0)
HCT: 40.8 % (ref 39.0–52.0)
Hemoglobin: 13.1 g/dL (ref 13.0–17.0)
Hemoglobin: 13.2 g/dL (ref 13.0–17.0)
Immature Granulocytes: 0 %
Immature Granulocytes: 0 %
Lymphocytes Relative: 26 %
Lymphocytes Relative: 25 %
Lymphs Abs: 1 10*3/uL (ref 0.7–4.0)
Lymphs Abs: 1.3 10*3/uL (ref 0.7–4.0)
MCH: 33.5 pg (ref 26.0–34.0)
MCH: 34.1 pg — ABNORMAL HIGH (ref 26.0–34.0)
MCHC: 32.7 g/dL (ref 30.0–36.0)
MCHC: 32.4 g/dL (ref 30.0–36.0)
MCV: 102.6 fL — ABNORMAL HIGH (ref 80.0–100.0)
MCV: 105.4 fL — ABNORMAL HIGH (ref 80.0–100.0)
Monocytes Absolute: 0.2 10*3/uL (ref 0.1–1.0)
Monocytes Absolute: 0.4 10*3/uL (ref 0.1–1.0)
Monocytes Relative: 5 %
Monocytes Relative: 7 %
Neutro Abs: 2.7 10*3/uL (ref 1.7–7.7)
Neutro Abs: 3.3 10*3/uL (ref 1.7–7.7)
Neutrophils Relative %: 67 %
Neutrophils Relative %: 66 %
Platelets: 155 10*3/uL (ref 150–400)
Platelets: 183 10*3/uL (ref 150–400)
RBC: 3.91 MIL/uL — ABNORMAL LOW (ref 4.22–5.81)
RBC: 3.87 MIL/uL — ABNORMAL LOW (ref 4.22–5.81)
RDW: 13.7 % (ref 11.5–15.5)
RDW: 13.7 % (ref 11.5–15.5)
WBC: 4.1 10*3/uL (ref 4.0–10.5)
WBC: 5 10*3/uL (ref 4.0–10.5)
nRBC: 0 % (ref 0.0–0.2)
nRBC: 0 % (ref 0.0–0.2)

## 2020-03-10 LAB — CMP (CANCER CENTER ONLY)
ALT: 27 U/L (ref 0–44)
AST: 36 U/L (ref 15–41)
Albumin: 4.5 g/dL (ref 3.5–5.0)
Alkaline Phosphatase: 63 U/L (ref 38–126)
Anion gap: 11 (ref 5–15)
BUN: 13 mg/dL (ref 6–20)
CO2: 26 mmol/L (ref 22–32)
Calcium: 9 mg/dL (ref 8.9–10.3)
Chloride: 106 mmol/L (ref 98–111)
Creatinine: 1.04 mg/dL (ref 0.61–1.24)
GFR, Estimated: >60 mL/min (ref >60)
Glucose, Bld: 72 mg/dL (ref 70–99)
Potassium: 3.6 mmol/L (ref 3.5–5.1)
Sodium: 143 mmol/L (ref 135–145)
Total Bilirubin: 1.1 mg/dL (ref 0.3–1.2)
Total Protein: 8.3 g/dL — ABNORMAL HIGH (ref 6.5–8.1)

## 2020-03-10 MED ORDER — FENTANYL CITRATE (PF) 100 MCG/2ML IJ SOLN
INTRAMUSCULAR | Status: AC | PRN
Start: 2020-03-10 — End: 2020-03-10
  Administered 2020-03-10: 50 ug via INTRAVENOUS

## 2020-03-10 MED ORDER — FENTANYL CITRATE (PF) 100 MCG/2ML IJ SOLN
INTRAMUSCULAR | Status: AC
  Filled 2020-03-10: qty 2

## 2020-03-10 MED ORDER — LIDOCAINE HCL (PF) 1 % IJ SOLN
INTRAMUSCULAR | Status: AC | PRN
  Administered 2020-03-10: 10 mL via INTRADERMAL

## 2020-03-10 MED ORDER — SODIUM CHLORIDE 0.9 % IV SOLN: INTRAVENOUS | Status: DC

## 2020-03-10 NOTE — Procedures (Signed)
Interventional Radiology Procedure Note  Procedure: CT guided aspirate and core biopsy of right iliac bone Complications: None Recommendations: - Bedrest supine x 1 hrs - Hydrocodone PRN  Pain - Follow biopsy results  Signed,  Stela Iwasaki K. Dustine Stickler, MD   

## 2020-03-10 NOTE — Discharge Instructions (Addendum)
Please call Interventional Radiology clinic [phone] with any questions or concerns.  You may remove your dressing and shower tomorrow.   Bone Marrow Aspiration and Bone Marrow Biopsy, Adult, Care After This sheet gives you information about how to care for yourself after your procedure. Your health care provider may also give you more specific instructions. If you have problems or questions, contact your health care provider. What can I expect after the procedure? After the procedure, it is common to have:  Mild pain and tenderness.  Swelling.  Bruising. Follow these instructions at home: Puncture site care   Follow instructions from your health care provider about how to take care of the puncture site. Make sure you: ? Wash your hands with soap and water before and after you change your bandage (dressing). If soap and water are not available, use hand sanitizer. ? Change your dressing as told by your health care provider.  Check your puncture site every day for signs of infection. Check for: ? More redness, swelling, or pain. ? Fluid or blood. ? Warmth. ? Pus or a bad smell. Activity  Return to your normal activities as told by your health care provider. Ask your health care provider what activities are safe for you.  Do not lift anything that is heavier than 10 lb (4.5 kg), or the limit that you are told, until your health care provider says that it is safe.  Do not drive for 24 hours if you were given a sedative during your procedure. General instructions   Take over-the-counter and prescription medicines only as told by your health care provider.  Do not take baths, swim, or use a hot tub until your health care provider approves. Ask your health care provider if you may take showers. You may only be allowed to take sponge baths.  If directed, put ice on the affected area. To do this: ? Put ice in a plastic bag. ? Place a towel between your skin and the  bag. ? Leave the ice on for 20 minutes, 2-3 times a day.  Keep all follow-up visits as told by your health care provider. This is important. Contact a health care provider if:  Your pain is not controlled with medicine.  You have a fever.  You have more redness, swelling, or pain around the puncture site.  You have fluid or blood coming from the puncture site.  Your puncture site feels warm to the touch.  You have pus or a bad smell coming from the puncture site. Summary  After the procedure, it is common to have mild pain, tenderness, swelling, and bruising.  Follow instructions from your health care provider about how to take care of the puncture site and what activities are safe for you.  Take over-the-counter and prescription medicines only as told by your health care provider.  Contact a health care provider if you have any signs of infection, such as fluid or blood coming from the puncture site. This information is not intended to replace advice given to you by your health care provider. Make sure you discuss any questions you have with your health care provider. Document Revised: 09/05/2018 Document Reviewed: 09/05/2018 Elsevier Patient Education  2020 [employer]  

## 2020-03-10 NOTE — Consult Note (Signed)
Chief Complaint: Patient was seen in consultation today for CT guided bone marrow biopsy  Referring Physician(s): Brunetta Genera  Supervising Physician: Jacqulynn Cadet  Patient Status: [facility] - Out-pt  History of Present Illness: Ryan Nixon is a 59 y.o. male with history of mild anemia, weight loss, IgG kappa monoclonal paraproteinemia and M spike on SPEP who presents today for CT-guided bone marrow biopsy for further evaluation/rule out myeloma.  Past Medical History:  Diagnosis Date  . Headache     No past surgical history on file.  Allergies: Patient has no allergy information on record.  Medications: Prior to Admission medications   Medication Sig Start Date End Date Taking? Authorizing Provider  atorvastatin (LIPITOR) 20 MG tablet Take 20 mg by mouth daily. 02/03/19   [provider]  chlorthalidone (HYGROTON) 25 MG tablet Take  1/2 12/29/18   [provider]  omeprazole (PRILOSEC) 20 MG capsule Take 20 mg by mouth daily. 03/15/19   [provider]  ondansetron (ZOFRAN) 8 MG tablet  03/15/19   [provider]  Potassium Chloride ER 20 MEQ TBCR  03/15/19   [provider]  SUMAtriptan (IMITREX) 100 MG tablet Take 1 tablet with 600 mg of ibuprofen at the first sign of headache, may repeat in Imitrex only in 2 hours if needed.  Max 200/24hrs. 08/04/18   [provider]  Vitamin D, Ergocalciferol, (DRISDOL) 1.25 MG (50000 UNIT) CAPS capsule TAKE 1 CAPSULE BY MOUTH ONE TIME PER WEEK 11/28/19   Brunetta Genera, MD     No family history on file.  Social History   Socioeconomic History  . Marital status: Married    Spouse name: Not on file  . Number of children: Not on file  . Years of education: Not on file  . Highest education level: Not on file  Occupational History  . Not on file  Tobacco Use  . Smoking status: Never Smoker  . Smokeless tobacco: Never Used  Substance and Sexual Activity  . Alcohol  use: Yes    Alcohol/week: 2.0 standard drinks    Types: 1 Cans of beer, 1 Glasses of wine per week    Comment: occasionally  . Drug use: Not Currently  . Sexual activity: Not on file  Other Topics Concern  . Not on file  Social History Narrative  . Not on file   Social Determinants of Health   Financial Resource Strain:   . Difficulty of Paying Living Expenses: Not on file  Food Insecurity:   . Worried About Charity fundraiser in the Last Year: Not on file  . Ran Out of Food in the Last Year: Not on file  Transportation Needs:   . Lack of Transportation (Medical): Not on file  . Lack of Transportation (Non-Medical): Not on file  Physical Activity:   . Days of Exercise per Week: Not on file  . Minutes of Exercise per Session: Not on file  Stress:   . Feeling of Stress : Not on file  Social Connections:   . Frequency of Communication with Friends and Family: Not on file  . Frequency of Social Gatherings with Friends and Family: Not on file  . Attends Religious Services: Not on file  . Active Member of Clubs or Organizations: Not on file  . Attends Archivist Meetings: Not on file  . Marital Status: Not on file      Review of Systems denies fever, headache, chest pain, dyspnea, cough, abdominal pain,  nausea, vomiting or bleeding.  He does have some occasional back pain and above-mentioned weight loss.  Vital Signs:pending   Physical Exam awake, alert.  Chest clear to auscultation bilaterally.  Heart with regular rate and rhythm.  Abdomen soft, positive bowel sounds, nontender.  No lower extremity edema.  Imaging: No results found.  Labs:  CBC: Recent Labs    04/02/19 1248 04/02/19 1251 06/05/19 1110 12/12/19 1012 03/10/20 0845  WBC 6.1  --  4.4 4.9 4.1  HGB 14.0  --  13.5 13.1 13.1  HCT 41.1 40.6 40.2 39.2 40.1  PLT 171  --  168 164 155    COAGS: No results for input(s): INR, APTT in the last 8760 hours.  BMP: Recent Labs    04/02/19 1248  06/05/19 1110 12/12/19 1012  NA 139 142 140  K 3.8 3.5 4.0  CL 99 105 103  CO2 $Re'27 29 29  'kOh$ GLUCOSE 100* 93 84  BUN $Re'14 10 6  'WDG$ CALCIUM 10.0 9.5 9.9  CREATININE 1.03 1.11 1.16  GFRNONAA >60 >60 >60  GFRAA >60 >60 >60    LIVER FUNCTION TESTS: Recent Labs    04/02/19 1248 06/05/19 1110 12/12/19 1012  BILITOT 1.8* 1.2 1.0  AST 35 27 44*  ALT 27 31 34  ALKPHOS 55 64 73  PROT 8.5* 8.1 8.0  ALBUMIN 4.8 4.4 4.1    TUMOR MARKERS: No results for input(s): AFPTM, CEA, CA199, CHROMGRNA in the last 8760 hours.  Assessment and Plan: 59 y.o. male with history of mild anemia, weight loss, IgG kappa monoclonal paraproteinemia and M spike on SPEP who presents today for CT-guided bone marrow biopsy for further evaluation/rule out myeloma.Risks and benefits of procedure was discussed with the patient  including, but not limited to bleeding, infection, damage to adjacent structures or low yield requiring additional tests.  All of the questions were answered and there is agreement to proceed.  Consent signed and in chart.    Thank you for this interesting consult.  I greatly enjoyed meeting Ryan Nixon and look forward to participating in their care.  A copy of this report was sent to the requesting provider on this date.  Electronically Signed: D. Rowe Robert, PA-C 03/10/2020, 9:49 AM   I spent a total of  20 minutes   in face to face in clinical consultation, greater than 50% of which was counseling/coordinating care for CT-guided bone marrow biopsy

## 2020-03-11 LAB — MULTIPLE MYELOMA PANEL, SERUM
Albumin SerPl Elph-Mcnc: 4.2 g/dL (ref 2.9–4.4)
Albumin/Glob SerPl: 1.2 (ref 0.7–1.7)
Alpha 1: 0.2 g/dL (ref 0.0–0.4)
Alpha2 Glob SerPl Elph-Mcnc: 0.5 g/dL (ref 0.4–1.0)
B-Globulin SerPl Elph-Mcnc: 1 g/dL (ref 0.7–1.3)
Gamma Glob SerPl Elph-Mcnc: 2 g/dL — ABNORMAL HIGH (ref 0.4–1.8)
Globulin, Total: 3.6 g/dL (ref 2.2–3.9)
IgA: 130 mg/dL (ref 90–386)
IgG (Immunoglobin G), Serum: 2034 mg/dL — ABNORMAL HIGH (ref 603–1613)
IgM (Immunoglobulin M), Srm: 36 mg/dL (ref 20–172)
M Protein SerPl Elph-Mcnc: 1.5 g/dL — ABNORMAL HIGH
Total Protein ELP: 7.8 g/dL (ref 6.0–8.5)

## 2020-03-19 ENCOUNTER — Encounter (HOSPITAL_COMMUNITY): Payer: Self-pay | Admitting: Hematology

## 2020-03-21 LAB — SURGICAL PATHOLOGY

## 2020-03-24 ENCOUNTER — Telehealth: Payer: Self-pay | Admitting: *Deleted

## 2020-03-24 ENCOUNTER — Inpatient Hospital Stay: Payer: 59 | Admitting: Hematology

## 2020-03-24 NOTE — Telephone Encounter (Signed)
Opened erroneously

## 2020-03-25 ENCOUNTER — Telehealth: Payer: Self-pay | Admitting: Hematology

## 2020-03-25 NOTE — Telephone Encounter (Signed)
Called pt per 11/22 sch msg - no answer. Left message for patient to call back to reschedule missed apt.

## 2020-04-01 ENCOUNTER — Telehealth: Payer: Self-pay | Admitting: Hematology

## 2020-04-01 NOTE — Telephone Encounter (Signed)
Called pt per 11/30 sch msg - no answer. Left message for patient to call back to reschedule appt.

## 2020-04-07 ENCOUNTER — Inpatient Hospital Stay: Payer: 59 | Attending: Hematology | Admitting: Hematology

## 2020-04-07 DIAGNOSIS — E8809 Other disorders of plasma-protein metabolism, not elsewhere classified: Secondary | ICD-10-CM | POA: Diagnosis not present

## 2020-04-07 DIAGNOSIS — R112 Nausea with vomiting, unspecified: Secondary | ICD-10-CM | POA: Insufficient documentation

## 2020-04-07 DIAGNOSIS — D696 Thrombocytopenia, unspecified: Secondary | ICD-10-CM | POA: Insufficient documentation

## 2020-04-07 DIAGNOSIS — Z79899 Other long term (current) drug therapy: Secondary | ICD-10-CM | POA: Insufficient documentation

## 2020-04-07 DIAGNOSIS — G43909 Migraine, unspecified, not intractable, without status migrainosus: Secondary | ICD-10-CM | POA: Insufficient documentation

## 2020-04-07 DIAGNOSIS — D649 Anemia, unspecified: Secondary | ICD-10-CM | POA: Insufficient documentation

## 2020-04-07 DIAGNOSIS — R634 Abnormal weight loss: Secondary | ICD-10-CM | POA: Insufficient documentation

## 2020-04-07 NOTE — Progress Notes (Signed)
HEMATOLOGY/ONCOLOGY CLINIC NOTE  Date of Service: 04/07/2020  Patient Care Team: London Pepper, MD as PCP - General (Family Medicine)  CHIEF COMPLAINTS/PURPOSE OF CONSULTATION:  Thrombocytopenia and developing Anemia  HISTORY OF PRESENTING ILLNESS:   Ryan Nixon is a wonderful 59 y.o. male who has been referred to [place] by Dr Orland Mustard for evaluation and management of thrombocytopenia and developing anemia. The pt reports that he is doing well overall.   The pt reports that the last time he felt like himself was in [place]. He knew that things were different when he was no longer able to play tennis, which he did so often before. Pt has been experiencing nausea, vomiting, fatigue and weight loss. He has had a 17-18 lb weight loss over the course of the last 8 months. His highest weight was around 139 lbs and his lowest was 119 lbs. Pt has had fevers accompanied by cold sweats and headaches over the last few months. His night sweats were drenching, but have since improved. This began around the same time as his other symptoms. He also notes hand peeling in both hands, over the entire palm. He has had a couple of recent episodes of memory loss. Once he wound up in [place] with no knowledge of how he got there. Pt has not had a Colonoscopy since he has turned 52 but has had an EGD, which was set up by his GI, Dr. Watt Climes. Nothing significant was found on his EGD. Dr. Watt Climes placed the pt on Omeprazole 2 weeks ago and the pt has noticed a severe decrease in his vomiting. He has been eating better and has even gained some of his weight back.   Pt has been taking his migraine medication once per week as prophylaxis and then only as needed. He has had migraine headaches for at least the last few years and does have a sensitivity to light and sound with his migraines. Pt has been on HTN medication for about 10 years and is currently on a half dose of his medication. He believes that his HTN is stable.  He denies any other new medications or symptoms. He recently had an brain MRI and has an appointment with a Neurologist tomorrow.   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 His mother passed last year and her death has had saddened him greatly. He is not sure if he is, or recently has been struggling with depression. His grandmother was diagnosed with dementia in her 51's. He drinks up to a 6 pack of beer when he plays tennis and has 3-4 glasses of wine per week. Pt does not drink much hard liquor at all. Pt has no dietary restrictions and eats at least 2 meals per day.   Of note prior to the patient's visit today, pt has had Brain MRI completed on 03/20/2019 with results revealing "1. No acute intracranial abnormality. 2. Several white matter lesions are identified, as described above. These lesions are abnormal but nonspecific, usually resulting from benign/remote/incidental causes (e.g. prior trauma/inflammation/demyelinization, or chronic ischemia associated with  migraines/atherosclerosis/other vasculopathies)."   Most recent lab results (03/15/2019) of CBC is as follows: WBC at 5.7K, RBC at 3.47, Hgb at 12.5, HCT at 36.5, MCV at 105.3, MCH at 36.0, MCHC at 34.1, RDW at 14.1, PLTs at 113K, MPV at 10.8, nRBC Abs at 0.01K, Neutro Rel at 69.4, nRBC Rel at 0.10, Lymphs Rel at 21.1, Mono Rel at 8.3, Eos Rel at 0.8, Baso Rel at  0.4, Neutro at 3.9K, Lymph Abs at 1.20K, Mono Abs at 0.5K, Eos Abs at 0.0K, Baso Abs at 0.0K, Glucose at 93, BUN at 8, Creatinine at 0.86, GFR Est AFR Am at 110, Sodium at 141, Potassium at 4.1, Chloride at 106, CO2 at 28, Anion gap at 11.8, Calcium at 9.7, Total Protein at 7.4, Albumin at 4.6, Total Bilirubin at 0.5, ALP at 49, AST at 47, ALT at 50.  03/15/2019 TSH at 1.29  On review of systems, pt reports nausea, improving vomiting, significant weight loss, fatigue, b/l hand peeling, improving night sweats. short-term memory loss and denies constipation, abdominal pain,  diarrhea, breathing changes, chest pain, new bone pain, new joint pain, pain along the spine and any other symptoms.   On PMHx the pt reports HTN, Migraines, HLD. On Social Hx the pt reports that he drinks 3-4 glasses of wine per week, up to 6 beers in 1 sitting occasionally.  On Family Hx the pt reports that his grandmother had dementia that was diagnosed in her 65's   INTERVAL HISTORY:  I connected with  Hildred Alamin on 04/07/20 by telephone and verified that I am speaking with the correct person using two identifiers.   I discussed the limitations of evaluation and management by telemedicine. The patient expressed understanding and agreed to proceed.  Other persons participating in the visit and their role in the encounter:        -Carollee Herter, Medical Scribe  Patient's location: Home Provider's location: [facility] is a wonderful 59 y.o. male who is here for evaluation and management of thrombocytopenia and developing anemia. The patient's last visit with [place] was on 12/17/2019. The pt reports that he is doing well overall.  The pt reports that he has felt well and denies any new concerns. Pt is scheduled to receive his COVID19 booster this afternoon and has already received his annual flu vaccine.   Of note since the patient's last visit, pt has had Cytogenetics completed on 03/10/2020 with results revealing Normal Male Karyotype.  Of note since the patient's last visit, pt has had Bone Marrow Bx Report ([id number]) completed on 03/10/2020 with results revealing "BONE MARROW, ASPIRATE, CLOT, CORE: - Normocellular marrow with kappa predominant plasmacytosis. ? 5% Plasma cells."  Lab results (03/10/20) of CBC w/diff and CMP is as follows: all values are WNL except for RBC at 3.87, MCV at 105.4, MCH at 34.1, Total Protein at 8.3. 03/10/2020 MMP shows all values are WNL except for IgG at 2034, Gamma Glob at 2.0, M Protein at 1.5  On review of systems, pt   denies fatigue, bone pain and any other symptoms.    MEDICAL HISTORY:  Hyperlipidemia Hypertension   SURGICAL HISTORY: No known surgical history   SOCIAL HISTORY: Social History   Socioeconomic History  . Marital status: Married    Spouse name: Not on file  . Number of children: Not on file  . Years of education: Not on file  . Highest education level: Not on file  Occupational History  . Not on file  Tobacco Use  . Smoking status: Never Smoker  . Smokeless tobacco: Never Used  Substance and Sexual Activity  . Alcohol use: Yes    Alcohol/week: 2.0 standard drinks    Types: 1 Cans of beer, 1 Glasses of wine per week    Comment: occasionally  . Drug use: Not Currently  . Sexual activity: Not on file  Other Topics Concern  . Not  on file  Social History Narrative  . Not on file   Social Determinants of Health   Financial Resource Strain:   . Difficulty of Paying Living Expenses: Not on file  Food Insecurity:   . Worried About Charity fundraiser in the Last Year: Not on file  . Ran Out of Food in the Last Year: Not on file  Transportation Needs:   . Lack of Transportation (Medical): Not on file  . Lack of Transportation (Non-Medical): Not on file  Physical Activity:   . Days of Exercise per Week: Not on file  . Minutes of Exercise per Session: Not on file  Stress:   . Feeling of Stress : Not on file  Social Connections:   . Frequency of Communication with Friends and Family: Not on file  . Frequency of Social Gatherings with Friends and Family: Not on file  . Attends Religious Services: Not on file  . Active Member of Clubs or Organizations: Not on file  . Attends Archivist Meetings: Not on file  . Marital Status: Not on file  Intimate Partner Violence:   . Fear of Current or Ex-Partner: Not on file  . Emotionally Abused: Not on file  . Physically Abused: Not on file  . Sexually Abused: Not on file    FAMILY HISTORY: No family history on  file.  ALLERGIES:  has no allergies on file. No known drug allergies   MEDICATIONS:  Current Outpatient Medications  Medication Sig Dispense Refill  . atorvastatin (LIPITOR) 20 MG tablet Take 20 mg by mouth daily.    . chlorthalidone (HYGROTON) 25 MG tablet Take  1/2    . omeprazole (PRILOSEC) 20 MG capsule Take 20 mg by mouth daily.    . ondansetron (ZOFRAN) 8 MG tablet     . Potassium Chloride ER 20 MEQ TBCR     . SUMAtriptan (IMITREX) 100 MG tablet Take 1 tablet with 600 mg of ibuprofen at the first sign of headache, may repeat in Imitrex only in 2 hours if needed.  Max 200/24hrs.    . Vitamin D, Ergocalciferol, (DRISDOL) 1.25 MG (50000 UNIT) CAPS capsule TAKE 1 CAPSULE BY MOUTH ONE TIME PER WEEK 12 capsule 0   No current facility-administered medications for this visit.    REVIEW OF SYSTEMS:   A 10+ POINT REVIEW OF SYSTEMS WAS OBTAINED including neurology, dermatology, psychiatry, cardiac, respiratory, lymph, extremities, GI, GU, Musculoskeletal, constitutional, breasts, reproductive, HEENT.  All pertinent positives are noted in the HPI.  All others are negative.   PHYSICAL EXAMINATION: ECOG PERFORMANCE STATUS: 1 - Symptomatic but completely ambulatory  Telehealth visit 04/07/2020  LABORATORY DATA:  I have reviewed the data as listed  . CBC Latest Ref Rng & Units 03/10/2020 03/10/2020 12/12/2019  WBC 4.0 - 10.5 K/uL 5.0 4.1 4.9  Hemoglobin 13.0 - 17.0 g/dL 13.2 13.1 13.1  Hematocrit 39 - 52 % 40.8 40.1 39.2  Platelets 150 - 400 K/uL 183 155 164    . CMP Latest Ref Rng & Units 03/10/2020 12/12/2019 06/05/2019  Glucose 70 - 99 mg/dL 72 84 93  BUN 6 - 20 mg/dL $Remove'13 6 10  'LkXNhel$ Creatinine 0.61 - 1.24 mg/dL 1.04 1.16 1.11  Sodium 135 - 145 mmol/L 143 140 142  Potassium 3.5 - 5.1 mmol/L 3.6 4.0 3.5  Chloride 98 - 111 mmol/L 106 103 105  CO2 22 - 32 mmol/L $RemoveB'26 29 29  'DZKGufgQ$ Calcium 8.9 - 10.3 mg/dL 9.0 9.9 9.5  Total Protein 6.5 -  8.1 g/dL 8.3(H) 8.0 8.1  Total Bilirubin 0.3 - 1.2 mg/dL 1.1 1.0  1.2  Alkaline Phos 38 - 126 U/L 63 73 64  AST 15 - 41 U/L 36 44(H) 27  ALT 0 - 44 U/L 27 34 31   03/10/2020 Bone Marrow Bx Report ([id number]):    03/10/2020    RADIOGRAPHIC STUDIES: I have personally reviewed the radiological images as listed and agreed with the findings in the report. CT Biopsy  Result Date: 03/10/2020 INDICATION: 59 year old male with possible plasma cell dyscrasia. EXAM: CT GUIDED BONE MARROW ASPIRATION AND CORE BIOPSY Interventional Radiologist:  Criselda Peaches, MD MEDICATIONS: None. ANESTHESIA/SEDATION: 50 mcg fentanyl only.  This does not constitute moderate sedation. FLUOROSCOPY TIME:  None. COMPLICATIONS: None immediate. Estimated blood loss: <25 mL PROCEDURE: Informed written consent was obtained from the patient after a thorough discussion of the procedural risks, benefits and alternatives. All questions were addressed. Maximal Sterile Barrier Technique was utilized including caps, mask, sterile gowns, sterile gloves, sterile drape, hand hygiene and skin antiseptic. A timeout was performed prior to the initiation of the procedure. The patient was positioned prone and non-contrast localization CT was performed of the pelvis to demonstrate the iliac marrow spaces. Maximal barrier sterile technique utilized including caps, mask, sterile gowns, sterile gloves, large sterile drape, hand hygiene, and betadine prep. Under sterile conditions and local anesthesia, an 11 gauge coaxial bone biopsy needle was advanced into the right iliac marrow space. Needle position was confirmed with CT imaging. Initially, bone marrow aspiration was performed. Next, the 11 gauge outer cannula was utilized to obtain a right iliac bone marrow core biopsy. Needle was removed. Hemostasis was obtained with compression. The patient tolerated the procedure well. Samples were prepared with the cytotechnologist. IMPRESSION: Technically successful CT-guided right iliac bone marrow aspiration and core  biopsy. Electronically Signed   By: Jacqulynn Cadet M.D.   On: 03/10/2020 13:16   CT BONE MARROW BIOPSY & ASPIRATION  Result Date: 03/10/2020 INDICATION: 59 year old male with possible plasma cell dyscrasia. EXAM: CT GUIDED BONE MARROW ASPIRATION AND CORE BIOPSY Interventional Radiologist:  Criselda Peaches, MD MEDICATIONS: None. ANESTHESIA/SEDATION: 50 mcg fentanyl only.  This does not constitute moderate sedation. FLUOROSCOPY TIME:  None. COMPLICATIONS: None immediate. Estimated blood loss: <25 mL PROCEDURE: Informed written consent was obtained from the patient after a thorough discussion of the procedural risks, benefits and alternatives. All questions were addressed. Maximal Sterile Barrier Technique was utilized including caps, mask, sterile gowns, sterile gloves, sterile drape, hand hygiene and skin antiseptic. A timeout was performed prior to the initiation of the procedure. The patient was positioned prone and non-contrast localization CT was performed of the pelvis to demonstrate the iliac marrow spaces. Maximal barrier sterile technique utilized including caps, mask, sterile gowns, sterile gloves, large sterile drape, hand hygiene, and betadine prep. Under sterile conditions and local anesthesia, an 11 gauge coaxial bone biopsy needle was advanced into the right iliac marrow space. Needle position was confirmed with CT imaging. Initially, bone marrow aspiration was performed. Next, the 11 gauge outer cannula was utilized to obtain a right iliac bone marrow core biopsy. Needle was removed. Hemostasis was obtained with compression. The patient tolerated the procedure well. Samples were prepared with the cytotechnologist. IMPRESSION: Technically successful CT-guided right iliac bone marrow aspiration and core biopsy. Electronically Signed   By: Jacqulynn Cadet M.D.   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 No CT evidence of malignancy in the chest, abdomen, or pelvis. No lymphadenopathy. 2. Hepatic steatosis. 3. Descending and sigmoid diverticulosis without evidence of diverticulitis. 4. Mild, diffusely sclerotic appearance of the skeleton without focal lesion noted, of uncertain significance, most commonly seen with renal osteodystrophy."  -06/05/2019 Bone Scan Whole Body ([id number]) revealed "Normal bone scan." 2) Mild drop in hgb with mild borderline anemia 3) significant unexplained weight loss  PLAN: -Discussed pt labwork today, 03/10/20; blood counts and chemistries are nml, M Protein is stable at 1.5 g/dL.  -Discussed 03/10/2020 Cytogenetics which revealed Normal Male Karyotype. -Discussed 03/10/2020 Bone Marrow Bx Report ([id number]) which revealed "BONE MARROW, ASPIRATE, CLOT, CORE: - Normocellular marrow with kappa predominant plasmacytosis. ? 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 -Advised pt that there is about a 2% chance of progression from MGUS to Myeloma every year.  -Recommend pt stay up-to-date with age-appropriate vaccinations. Pt will receive Pneumonia and Shingles vaccines with PCP.  -Continue 50000 IU Vitamin D/Ergocalciferol weekly -Will see back in 6 months, with labs 1 week prior   FOLLOW UP: -RTC with Dr Irene Limbo with labs in 6 months. Plz schedule these labs 1 week prior to clinic visit   The total time spent in the appt was 20 minutes and more than 50% was on counseling and direct patient cares.  All of the patient's questions were answered with apparent satisfaction. The patient knows to call the clinic with any problems, questions or concerns.    [name] MD [place] AAHIVMS [facility] Hematology/Oncology Physician [facility]  (Office):       [phone] (Work cell):  [phone] (Fax):           [phone]  04/07/2020 11:59  AM  I, Yevette Edwards, am acting as a scribe for Dr. [name].   .I have reviewed the above documentation for accuracy and completeness, and I agree with the above. Brunetta Genera MD

## 2020-08-27 ENCOUNTER — Telehealth: Payer: Self-pay | Admitting: Hematology

## 2020-08-27 NOTE — Telephone Encounter (Signed)
R/S per 12/06 los, Patient aware.

## 2020-10-06 ENCOUNTER — Other Ambulatory Visit: Payer: 59

## 2020-10-13 ENCOUNTER — Ambulatory Visit: Payer: 59 | Admitting: Hematology

## 2020-10-20 IMAGING — CT CT NECK W/ CM
4 series · 15 of 33 positions shown, 18 images · IV contrast (omnipaque)
Comparison: None.

CLINICAL DATA: Metastatic malignancy suspected. Abdominal pain with
recurrent nausea and vomiting and significant weight loss.

EXAM:
CT NECK WITH CONTRAST
TECHNIQUE: Multidetector CT imaging of the neck was performed using the
standard protocol following the bolus administration of intravenous
contrast.
CONTRAST:  100mL OMNIPAQUE IOHEXOL 300 MG/ML  SOLN

[Series 1: axial neck · axial · 0.48mm/px · z∈[-253,-73]mm · 5 of 136 slices shown, 7 images]
[im 23/136  soft-tissue]
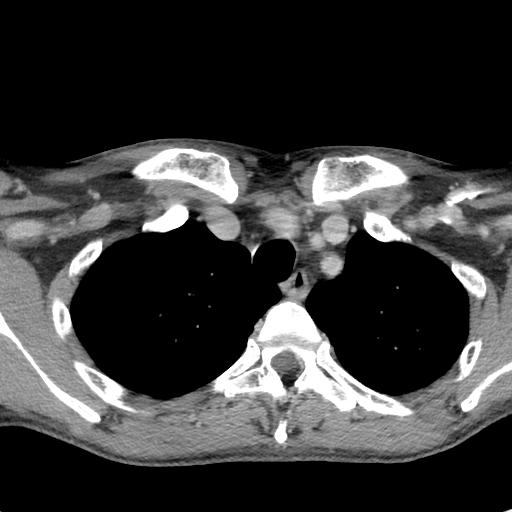
[im 23/136  bone]
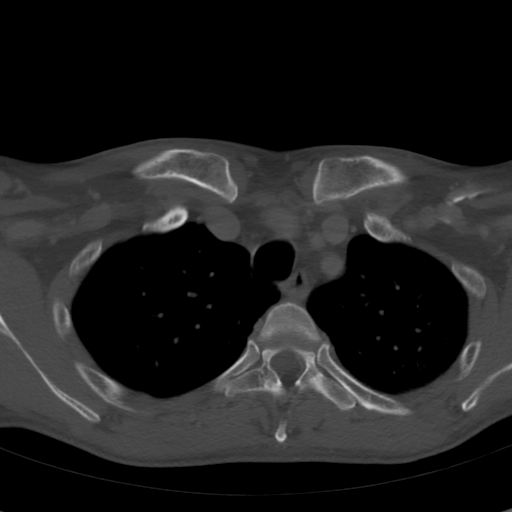
[im 46/136  bone]
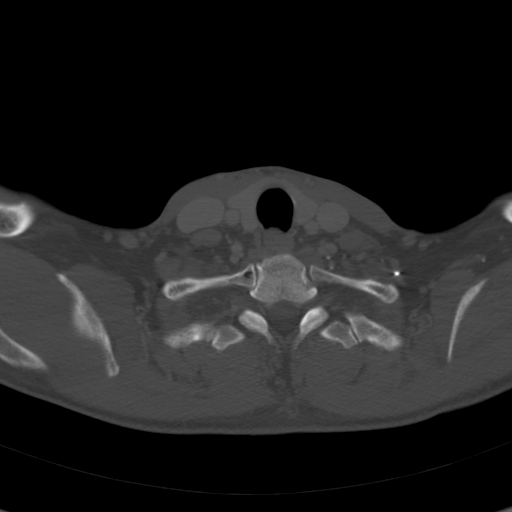
[im 68/136  bone]
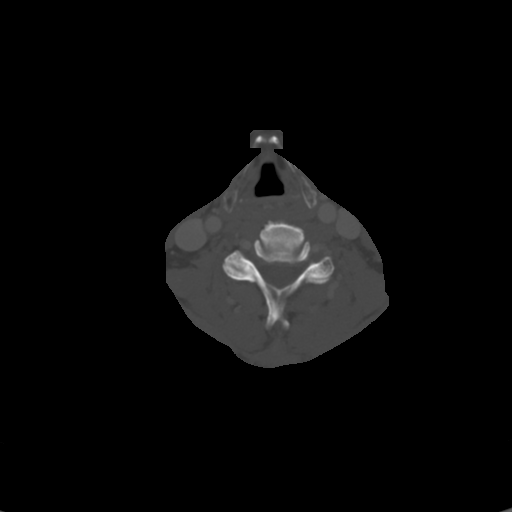
[im 91/136  bone]
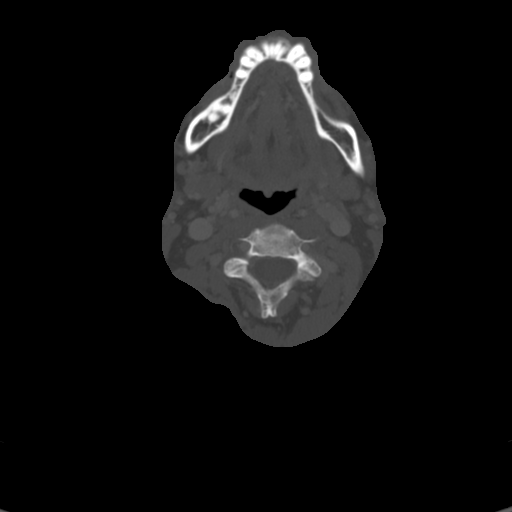
[im 113/136  soft-tissue]
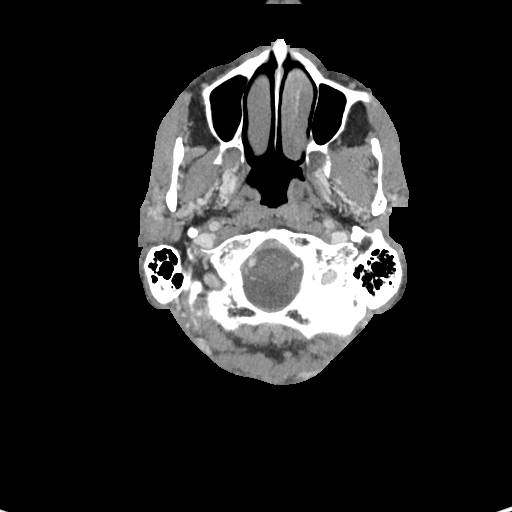
[im 113/136  bone]
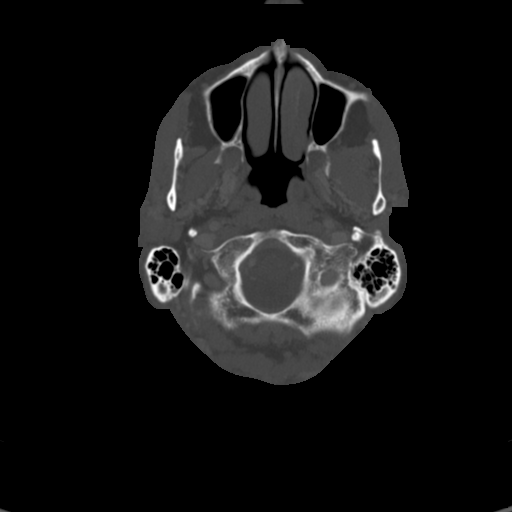

[Series 5: orthogonal ax · axial · 0.41mm/px · z∈[-253,-207]mm · 2 of 136 slices shown]
[im 23/136  bone]
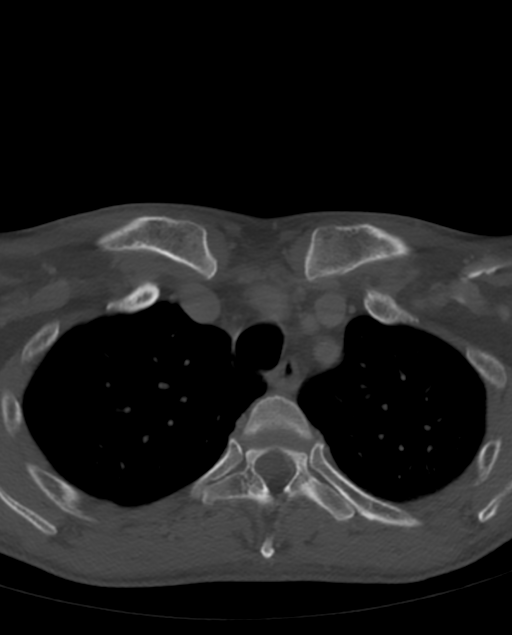
[im 46/136  bone]
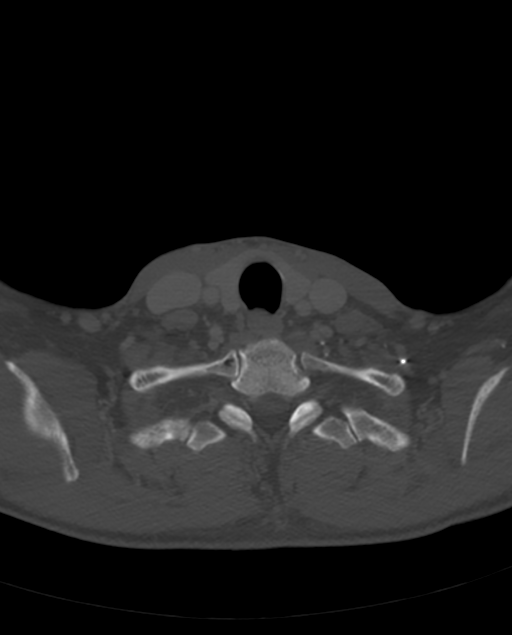

[Series 6: cor neck · coronal · 0.50mm/px · 3 of 116 slices shown]
[im 24/116  bone]
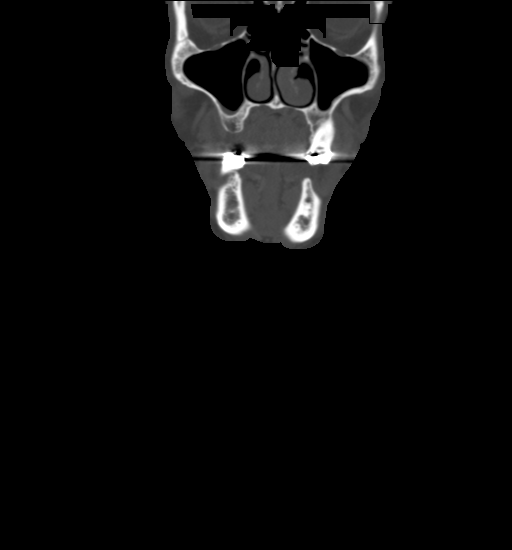
[im 47/116  bone]
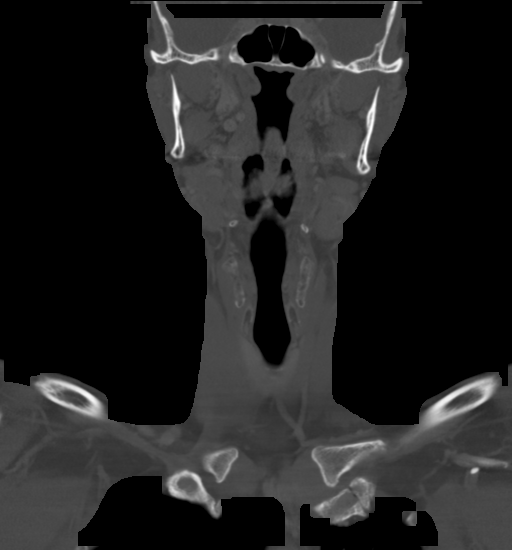
[im 70/116  bone]
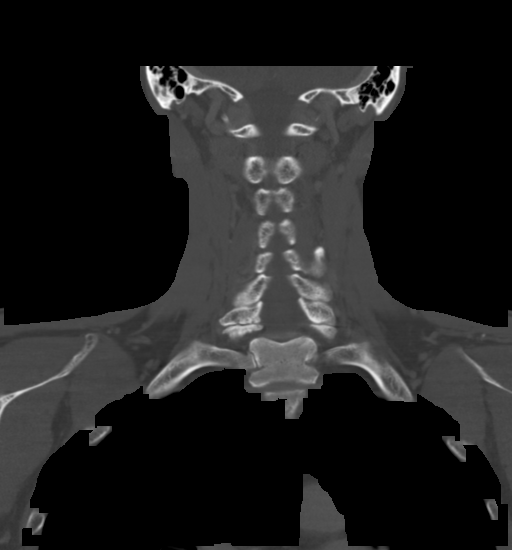

[Series 7: sag neck · sagittal · 0.47mm/px · 5 of 108 slices shown, 6 images]
[im 36/108  bone]
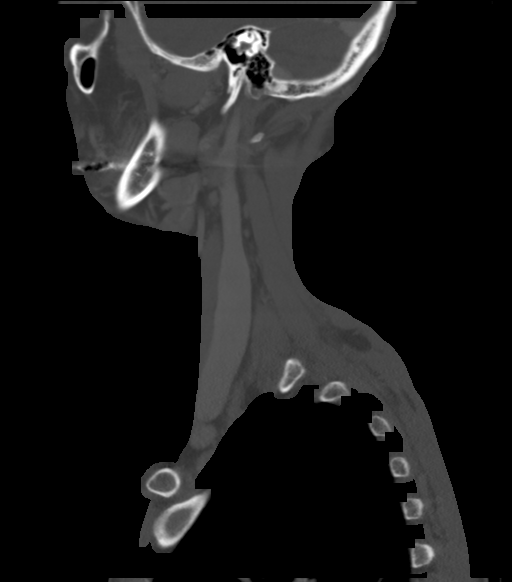
[im 45/108  bone]
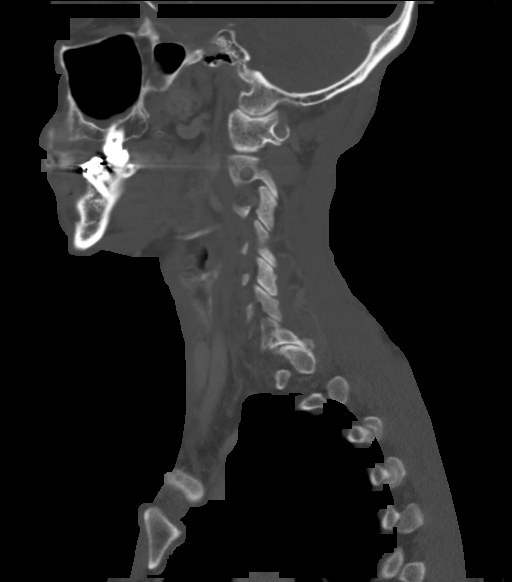
[im 54/108  soft-tissue]
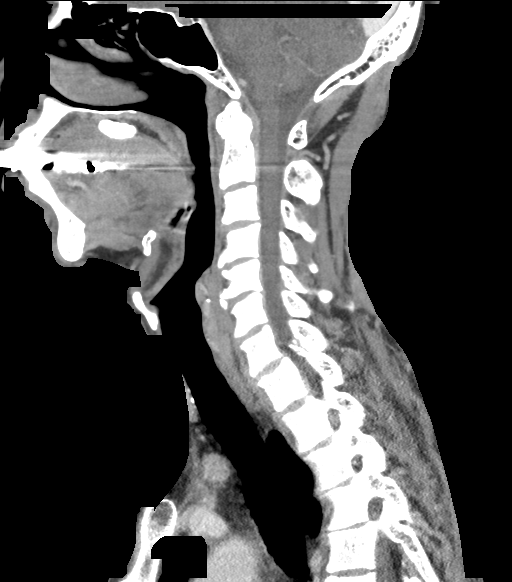
[im 54/108  bone]
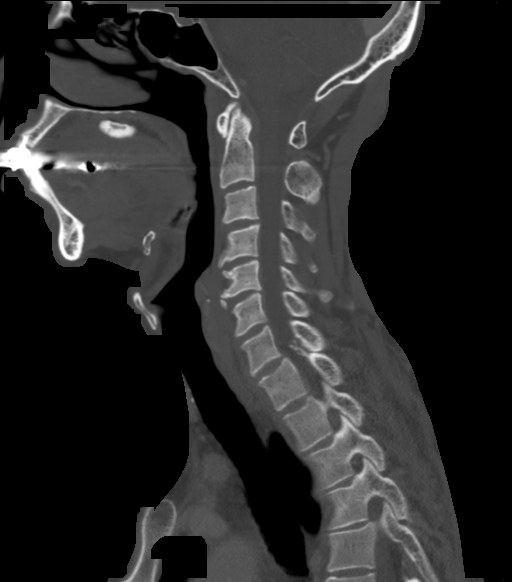
[im 63/108  bone]
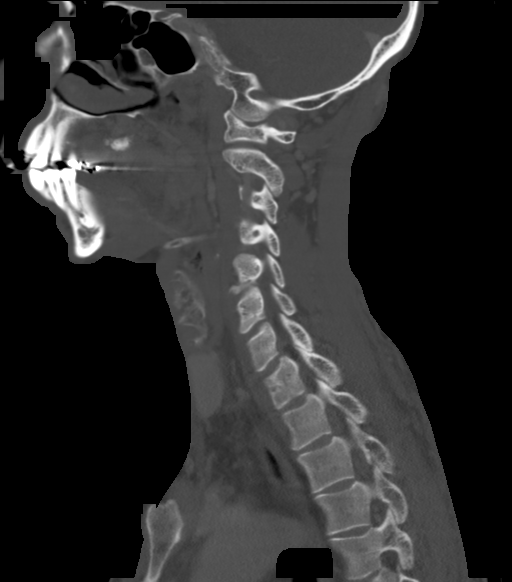
[im 72/108  bone]
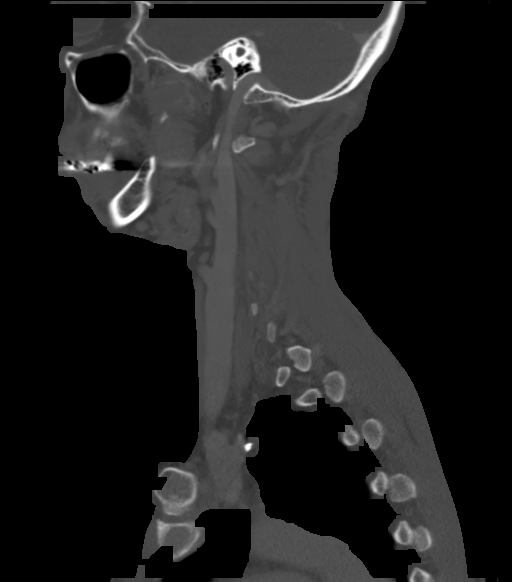

[15 of 33 positions shown; findings below may reference images not displayed]

FINDINGS: Pharynx and larynx: No evidence of mass or inflammation

Salivary glands: Symmetric prominence of submandibular ducts without
obstructive process. No inflammation or stone seen.

Thyroid: Normal

Lymph nodes: None enlarged or abnormal density.

Vascular: Prominent but not pathologic appearing and symmetric
paravertebral venous plexus draining into the internal jugular
veins.

Limited intracranial: Negative.

Visualized orbits: Negative.

Mastoids and visualized paranasal sinuses: Clear.

Skeleton: No acute or aggressive process.  Incidental torus palatini

Upper chest: Reported separately
IMPRESSION: Benign neck CT.

## 2020-10-28 ENCOUNTER — Other Ambulatory Visit: Payer: 59

## 2020-11-04 ENCOUNTER — Telehealth: Payer: Self-pay | Admitting: Hematology

## 2020-11-04 ENCOUNTER — Telehealth: Payer: Self-pay | Admitting: *Deleted

## 2020-11-04 ENCOUNTER — Ambulatory Visit: Payer: 59 | Admitting: Hematology

## 2020-11-04 NOTE — Telephone Encounter (Signed)
Contacted patient to inquire if he could come to [facility] this am prior to appt with Dr. Irene Limbo at [facility] to have labs done (he missed lab appt on 6/28) Patient requested reschedule so that he can get labs completed prior to seeing MD - he states he was out of town Appt for 7/5 cxld and schedule message sent to schedule MD appt with labs a week before as MD req on 04/07/20 los.

## 2020-11-04 NOTE — Telephone Encounter (Signed)
R/s appts per 7/5 sch msg. Pt aware.

## 2020-11-19 ENCOUNTER — Other Ambulatory Visit: Payer: 59

## 2020-11-26 ENCOUNTER — Ambulatory Visit: Payer: 59 | Admitting: Hematology

## 2021-01-30 DIAGNOSIS — Z125 Encounter for screening for malignant neoplasm of prostate: Secondary | ICD-10-CM | POA: Diagnosis not present

## 2021-01-30 DIAGNOSIS — Z23 Encounter for immunization: Secondary | ICD-10-CM | POA: Diagnosis not present

## 2021-01-30 DIAGNOSIS — D649 Anemia, unspecified: Secondary | ICD-10-CM | POA: Diagnosis not present

## 2021-01-30 DIAGNOSIS — E785 Hyperlipidemia, unspecified: Secondary | ICD-10-CM | POA: Diagnosis not present

## 2021-01-30 DIAGNOSIS — Z0000 Encounter for general adult medical examination without abnormal findings: Secondary | ICD-10-CM | POA: Diagnosis not present

## 2021-03-10 ENCOUNTER — Encounter: Payer: Self-pay | Admitting: Hematology

## 2021-03-12 ENCOUNTER — Telehealth: Payer: Self-pay | Admitting: Hematology

## 2021-03-12 NOTE — Telephone Encounter (Signed)
Scheduled per sch msg. Called and left msg. Mailed printotu

## 2021-03-30 ENCOUNTER — Inpatient Hospital Stay: Payer: BC Managed Care – PPO | Attending: Hematology | Admitting: Hematology

## 2021-03-30 ENCOUNTER — Inpatient Hospital Stay: Payer: BC Managed Care – PPO

## 2021-03-30 NOTE — Progress Notes (Signed)
Pt was no call /no show for appointment today. Contacted pt to verify he wanted to be seen by Dr Irene Limbo as pt has had multiple cancellations and no shows in the past. Pt stated he wanted to be rescheduled. Schedule message sent.

## 2021-03-30 NOTE — Progress Notes (Incomplete)
HEMATOLOGY/ONCOLOGY CLINIC NOTE  Date of Service: 03/30/2021  Patient Care Team: London Pepper, MD as PCP - General (Family Medicine)  CHIEF COMPLAINTS/PURPOSE OF CONSULTATION:  Thrombocytopenia and developing Anemia  HISTORY OF PRESENTING ILLNESS:   Ryan Nixon is a wonderful 60 y.o. male who has been referred to [place] by Dr Orland Mustard for evaluation and management of thrombocytopenia and developing anemia. The pt reports that he is doing well overall.   The pt reports that the last time he felt like himself was in [place]. He knew that things were different when he was no longer able to play tennis, which he did so often before. Pt has been experiencing nausea, vomiting, fatigue and weight loss. He has had a 17-18 lb weight loss over the course of the last 8 months. His highest weight was around 139 lbs and his lowest was 119 lbs. Pt has had fevers accompanied by cold sweats and headaches over the last few months. His night sweats were drenching, but have since improved. This began around the same time as his other symptoms. He also notes hand peeling in both hands, over the entire palm. He has had a couple of recent episodes of memory loss. Once he wound up in [place] with no knowledge of how he got there. Pt has not had a Colonoscopy since he has turned 50 but has had an EGD, which was set up by his GI, Dr. Watt Climes. Nothing significant was found on his EGD. Dr. Watt Climes placed the pt on Omeprazole 2 weeks ago and the pt has noticed a severe decrease in his vomiting. He has been eating better and has even gained some of his weight back.   Pt has been taking his migraine medication once per week as prophylaxis and then only as needed. He has had migraine headaches for at least the last few years and does have a sensitivity to light and sound with his migraines. Pt has been on HTN medication for about 10 years and is currently on a half dose of his medication. He believes that his HTN is stable.  He denies any other new medications or symptoms. He recently had an brain MRI and has an appointment with a Neurologist tomorrow.   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 His mother passed last year and her death has had saddened him greatly. He is not sure if he is, or recently has been struggling with depression. His grandmother was diagnosed with dementia in her 50's. He drinks up to a 6 pack of beer when he plays tennis and has 3-4 glasses of wine per week. Pt does not drink much hard liquor at all. Pt has no dietary restrictions and eats at least 2 meals per day.   Of note prior to the patient's visit today, pt has had Brain MRI completed on 03/20/2019 with results revealing "1. No acute intracranial abnormality. 2.  Several white matter lesions are identified, as described above. These lesions are abnormal but nonspecific, usually resulting from benign/remote/incidental causes (e.g. prior trauma/inflammation/demyelinization, or chronic ischemia associated with  migraines/atherosclerosis/other vasculopathies)."   Most recent lab results (03/15/2019) of CBC is as follows: WBC at 5.7K, RBC at 3.47, Hgb at 12.5, HCT at 36.5, MCV at 105.3, MCH at 36.0, MCHC at 34.1, RDW at 14.1, PLTs at 113K, MPV at 10.8, nRBC Abs at 0.01K, Neutro Rel at 69.4, nRBC Rel at 0.10, Lymphs Rel at 21.1, Mono Rel at 8.3, Eos Rel at 0.8, Baso Rel  at 0.4, Neutro at 3.9K, Lymph Abs at 1.20K, Mono Abs at 0.5K, Eos Abs at 0.0K, Baso Abs at 0.0K, Glucose at 93, BUN at 8, Creatinine at 0.86, GFR Est AFR Am at 110, Sodium at 141, Potassium at 4.1, Chloride at 106, CO2 at 28, Anion gap at 11.8, Calcium at 9.7, Total Protein at 7.4, Albumin at 4.6, Total Bilirubin at 0.5, ALP at 49, AST at 47, ALT at 50.  03/15/2019 TSH at 1.29  On review of systems, pt reports nausea, improving vomiting, significant weight loss, fatigue, b/l hand peeling, improving night sweats. short-term memory loss and denies constipation, abdominal pain,  diarrhea, breathing changes, chest pain, new bone pain, new joint pain, pain along the spine and any other symptoms.   On PMHx the pt reports HTN, Migraines, HLD. On Social Hx the pt reports that he drinks 3-4 glasses of wine per week, up to 6 beers in 1 sitting occasionally.  On Family Hx the pt reports that his grandmother had dementia that was diagnosed in her 50's   INTERVAL HISTORY:   Ryan Nixon is a wonderful 60 y.o. male who is here for evaluation and management of thrombocytopenia and developing anemia. The patient's last visit with [place] was on 12/17/2019. The pt reports that he is doing well overall.  The pt reports that he has felt well and denies any new concerns. Pt is scheduled to receive his COVID19 booster this afternoon and has already received his annual flu vaccine.   Of note since the patient's last visit, pt has had Cytogenetics completed on 03/10/2020 with results revealing Normal Male Karyotype.  Of note since the patient's last visit, pt has had Bone Marrow Bx Report ([id number]) completed on 03/10/2020 with results revealing "BONE MARROW, ASPIRATE, CLOT, CORE: -  Normocellular marrow with kappa predominant plasmacytosis. ? 5% Plasma cells."  03/30/2020  Today, patient is here for an evaluation.  He had lab work scheduled in July 2022 but had missed the appointment. He had to reschedule the lab appointment and the results will be evaluated.  ***  Lab results (03/10/20) of CBC w/diff and CMP is as follows: all values are WNL except for RBC at 3.87, MCV at 105.4, MCH at 34.1, Total Protein at 8.3. 03/10/2020 MMP shows all values are WNL except for IgG at 2034, Gamma Glob at 2.0, M Protein at 1.5  On review of systems, pt  denies fatigue, bone pain and any other symptoms.    MEDICAL HISTORY:  Hyperlipidemia Hypertension   SURGICAL HISTORY: No known surgical history   SOCIAL HISTORY: Social History   Socioeconomic History   Marital status: Married     Spouse name: Not on file   Number of children: Not on file   Years of education: Not on file   Highest education level: Not on file  Occupational History   Not on file  Tobacco Use   Smoking status: Never   Smokeless tobacco: Never  Substance and Sexual Activity   Alcohol use: Yes    Alcohol/week: 2.0 standard drinks    Types: 1 Cans of beer, 1 Glasses of wine per week    Comment: occasionally   Drug use: Not Currently   Sexual activity: Not on file  Other Topics Concern   Not on file  Social History Narrative   Not on file   Social Determinants of Health   Financial Resource Strain: Not on file  Food Insecurity: Not on file  Transportation Needs: Not on file  Physical Activity: Not on file  Stress: Not on file  Social Connections: Not on file  Intimate Partner Violence: Not on file    FAMILY HISTORY: No family history on file.  ALLERGIES:  has no allergies on file. No known drug allergies   MEDICATIONS:  Current Outpatient Medications  Medication Sig Dispense Refill   atorvastatin (LIPITOR) 20 MG tablet Take 20 mg by mouth daily.     chlorthalidone (HYGROTON) 25 MG tablet Take  1/2     omeprazole (PRILOSEC) 20 MG capsule Take 20 mg by mouth daily.     ondansetron (ZOFRAN) 8 MG tablet      Potassium Chloride ER 20 MEQ TBCR      SUMAtriptan (IMITREX) 100 MG tablet Take 1 tablet with 600 mg of ibuprofen at the first sign of headache, may repeat in Imitrex only in 2 hours if needed.  Max 200/24hrs.     Vitamin D, Ergocalciferol, (DRISDOL) 1.25 MG (50000 UNIT) CAPS capsule TAKE 1 CAPSULE BY MOUTH ONE TIME PER WEEK 12 capsule 0   No current facility-administered medications for this visit.    REVIEW OF SYSTEMS:   A 10+ POINT REVIEW OF SYSTEMS WAS OBTAINED including neurology, dermatology, psychiatry, cardiac, respiratory, lymph, extremities, GI, GU, Musculoskeletal, constitutional, breasts, reproductive, HEENT.  All pertinent positives are noted in the HPI.  All others  are negative.   PHYSICAL EXAMINATION: ECOG PERFORMANCE STATUS: 1 - Symptomatic but completely ambulatory  LABORATORY DATA:  I have reviewed the data as listed  . CBC Latest Ref Rng & Units 03/10/2020 03/10/2020 12/12/2019  WBC 4.0 - 10.5 K/uL 5.0 4.1 4.9  Hemoglobin 13.0 - 17.0 g/dL 13.2 13.1 13.1  Hematocrit 39.0 - 52.0 % 40.8 40.1 39.2  Platelets 150 - 400 K/uL 183 155 164    . CMP Latest Ref Rng & Units 03/10/2020 12/12/2019 06/05/2019  Glucose 70 - 99 mg/dL 72 84 93  BUN 6 - 20 mg/dL 13 6 10   Creatinine 0.61 - 1.24 mg/dL 1.04 1.16 1.11  Sodium 135 - 145 mmol/L 143 140 142  Potassium 3.5 - 5.1 mmol/L 3.6 4.0 3.5  Chloride 98 - 111 mmol/L 106 103 105  CO2 22 - 32 mmol/L 26 29 29   Calcium 8.9 - 10.3 mg/dL 9.0 9.9 9.5  Total Protein 6.5 - 8.1 g/dL 8.3(H) 8.0 8.1  Total Bilirubin 0.3 - 1.2 mg/dL 1.1 1.0 1.2  Alkaline Phos 38 - 126 U/L 63 73 64  AST 15 - 41 U/L 36 44(H) 27  ALT 0 - 44 U/L 27 34 31   03/10/2020 Bone Marrow Bx Report ([id number]):    03/10/2020    RADIOGRAPHIC STUDIES: I have personally reviewed the radiological images as listed and agreed with the findings in the report. No results found.   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 No CT evidence of malignancy in the chest, abdomen, or pelvis. No lymphadenopathy. 2. Hepatic steatosis. 3. Descending and sigmoid diverticulosis without evidence of diverticulitis. 4. Mild, diffusely sclerotic appearance of the skeleton without focal lesion noted, of uncertain significance, most commonly seen with renal osteodystrophy."  -06/05/2019 Bone Scan Whole Body ([id number]) revealed "Normal bone scan." 2) Mild drop in hgb with mild borderline anemia 3) significant unexplained weight loss  PLAN: -Discussed pt labwork today, 03/10/20; blood counts and chemistries are nml, M Protein is stable at 1.5  g/dL.  -Discussed  03/10/2020 Cytogenetics which revealed Normal Male Karyotype. -Discussed 03/10/2020 Bone Marrow Bx Report ([id number]) which revealed "BONE MARROW, ASPIRATE, CLOT, CORE: -  Normocellular marrow with kappa predominant plasmacytosis. ? 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 -Advised pt that there is about a 2% chance of progression from MGUS to Myeloma every year.  -Recommend pt stay up-to-date with age-appropriate vaccinations. Pt will receive Pneumonia and Shingles vaccines with PCP.  -Continue 50000 IU Vitamin D/Ergocalciferol weekly -Will see back in 6 months, with labs 1 week prior  03/30/2021:    FOLLOW UP: -RTC with Dr Irene Limbo with labs in 6 months. Plz schedule these labs 1 week prior to clinic visit***   The total time spent in the appt was 20 minutes and more than 50% was on counseling and direct patient cares.  All of the patient's questions were answered with apparent satisfaction. The patient knows to call the clinic with any problems, questions or concerns.   I,Zite Okoli,acting as a Education administrator for [name], MD.,have documented all relevant documentation on the behalf of [name], MD,as directed by  [name], MD while in the presence of [name], MD.   ***    [name] MD [place] [facility] Hematology/Oncology Physician [facility]  (Office):       [phone] (Work cell):  [phone] (Fax):           [phone]  03/30/2021 9:08 AM

## 2021-04-01 ENCOUNTER — Telehealth: Payer: Self-pay | Admitting: Hematology

## 2021-04-01 NOTE — Telephone Encounter (Signed)
Scheduled per sch msg. Called and spoke with patient. Confirmd appt

## 2021-04-08 ENCOUNTER — Other Ambulatory Visit: Payer: Self-pay

## 2021-04-08 DIAGNOSIS — E8809 Other disorders of plasma-protein metabolism, not elsewhere classified: Secondary | ICD-10-CM

## 2021-04-09 ENCOUNTER — Inpatient Hospital Stay: Payer: BC Managed Care – PPO | Attending: Hematology | Admitting: Hematology

## 2021-04-09 ENCOUNTER — Inpatient Hospital Stay: Payer: BC Managed Care – PPO

## 2021-04-09 ENCOUNTER — Other Ambulatory Visit: Payer: Self-pay

## 2021-04-09 VITALS — BP 139/85 | HR 65 | Temp 98.1°F | Resp 18 | Wt 124.9 lb

## 2021-04-09 DIAGNOSIS — E8809 Other disorders of plasma-protein metabolism, not elsewhere classified: Secondary | ICD-10-CM

## 2021-04-09 DIAGNOSIS — I10 Essential (primary) hypertension: Secondary | ICD-10-CM | POA: Diagnosis not present

## 2021-04-09 DIAGNOSIS — D472 Monoclonal gammopathy: Secondary | ICD-10-CM | POA: Diagnosis not present

## 2021-04-09 LAB — CBC WITH DIFFERENTIAL (CANCER CENTER ONLY)
Abs Immature Granulocytes: 0.01 10*3/uL (ref 0.00–0.07)
Basophils Absolute: 0 10*3/uL (ref 0.0–0.1)
Basophils Relative: 1 %
Eosinophils Absolute: 0 10*3/uL (ref 0.0–0.5)
Eosinophils Relative: 0 %
HCT: 36.5 % — ABNORMAL LOW (ref 39.0–52.0)
Hemoglobin: 12.3 g/dL — ABNORMAL LOW (ref 13.0–17.0)
Immature Granulocytes: 0 %
Lymphocytes Relative: 29 %
Lymphs Abs: 1.8 10*3/uL (ref 0.7–4.0)
MCH: 35 pg — ABNORMAL HIGH (ref 26.0–34.0)
MCHC: 33.7 g/dL (ref 30.0–36.0)
MCV: 104 fL — ABNORMAL HIGH (ref 80.0–100.0)
Monocytes Absolute: 0.2 10*3/uL (ref 0.1–1.0)
Monocytes Relative: 4 %
Neutro Abs: 4.1 10*3/uL (ref 1.7–7.7)
Neutrophils Relative %: 66 %
Platelet Count: 164 10*3/uL (ref 150–400)
RBC: 3.51 MIL/uL — ABNORMAL LOW (ref 4.22–5.81)
RDW: 13.5 % (ref 11.5–15.5)
WBC Count: 6.2 10*3/uL (ref 4.0–10.5)
nRBC: 0 % (ref 0.0–0.2)

## 2021-04-09 LAB — CMP (CANCER CENTER ONLY)
ALT: 16 U/L (ref 0–44)
AST: 29 U/L (ref 15–41)
Albumin: 4.4 g/dL (ref 3.5–5.0)
Alkaline Phosphatase: 51 U/L (ref 38–126)
Anion gap: 13 (ref 5–15)
BUN: 13 mg/dL (ref 8–23)
CO2: 24 mmol/L (ref 22–32)
Calcium: 8.5 mg/dL — ABNORMAL LOW (ref 8.9–10.3)
Chloride: 109 mmol/L (ref 98–111)
Creatinine: 0.97 mg/dL (ref 0.61–1.24)
GFR, Estimated: >60 mL/min (ref >60)
Glucose, Bld: 73 mg/dL (ref 70–99)
Potassium: 3.9 mmol/L (ref 3.5–5.1)
Sodium: 146 mmol/L — ABNORMAL HIGH (ref 135–145)
Total Bilirubin: 0.8 mg/dL (ref 0.3–1.2)
Total Protein: 8 g/dL (ref 6.5–8.1)

## 2021-04-09 MED ORDER — VITAMIN D (ERGOCALCIFEROL) 1.25 MG (50000 UNIT) PO CAPS
ORAL_CAPSULE | ORAL | 2 refills | Status: DC
Start: 2021-04-09 — End: 2021-06-02

## 2021-04-10 ENCOUNTER — Telehealth: Payer: Self-pay | Admitting: Hematology

## 2021-04-10 NOTE — Telephone Encounter (Signed)
Scheduled follow-up appointments per 12/8 los. Patient is aware.

## 2021-04-13 LAB — MULTIPLE MYELOMA PANEL, SERUM
Albumin SerPl Elph-Mcnc: 4.2 g/dL (ref 2.9–4.4)
Albumin/Glob SerPl: 1.3 (ref 0.7–1.7)
Alpha 1: 0.2 g/dL (ref 0.0–0.4)
Alpha2 Glob SerPl Elph-Mcnc: 0.4 g/dL (ref 0.4–1.0)
B-Globulin SerPl Elph-Mcnc: 0.9 g/dL (ref 0.7–1.3)
Gamma Glob SerPl Elph-Mcnc: 1.9 g/dL — ABNORMAL HIGH (ref 0.4–1.8)
Globulin, Total: 3.4 g/dL (ref 2.2–3.9)
IgA: 131 mg/dL (ref 61–437)
IgG (Immunoglobin G), Serum: 2021 mg/dL — ABNORMAL HIGH (ref 603–1613)
IgM (Immunoglobulin M), Srm: 44 mg/dL (ref 20–172)
M Protein SerPl Elph-Mcnc: 1.3 g/dL — ABNORMAL HIGH
Total Protein ELP: 7.6 g/dL (ref 6.0–8.5)

## 2021-04-15 NOTE — Progress Notes (Addendum)
HEMATOLOGY/ONCOLOGY CLINIC NOTE  Date of Service: .04/09/2021   Patient Care Team: Farris Has, MD as PCP - General (Family Medicine)  CHIEF COMPLAINTS/PURPOSE OF CONSULTATION:  Follow-up for monoclonal paraproteinemia  HISTORY OF PRESENTING ILLNESS:  Please see previous note for details on initial presentation  INTERVAL HISTORY:   Ryan Nixon is here for follow-up of his monoclonal paraproteinemia likely related to MGUS.  His last clinic visit with [place] was on 04/07/2020. He notes no acute new symptoms since his last clinic visit. No unexpected weight loss.  No significant new fatigue.  No new bone pains. Labs done on 04/09/2021 were reviewed with him.  MEDICAL HISTORY:  Hyperlipidemia Hypertension   SURGICAL HISTORY: No known surgical history   SOCIAL HISTORY: Social History   Socioeconomic History   Marital status: Married    Spouse name: Not on file   Number of children: Not on file   Years of education: Not on file   Highest education level: Not on file  Occupational History   Not on file  Tobacco Use   Smoking status: Never   Smokeless tobacco: Never  Substance and Sexual Activity   Alcohol use: Yes    Alcohol/week: 2.0 standard drinks    Types: 1 Cans of beer, 1 Glasses of wine per week    Comment: occasionally   Drug use: Not Currently   Sexual activity: Not on file  Other Topics Concern   Not on file  Social History Narrative   Not on file   Social Determinants of Health   Financial Resource Strain: Not on file  Food Insecurity: Not on file  Transportation Needs: Not on file  Physical Activity: Not on file  Stress: Not on file  Social Connections: Not on file  Intimate Partner Violence: Not on file    FAMILY HISTORY: No family history on file.  ALLERGIES:  has no allergies on file. No known drug allergies   MEDICATIONS:  Current Outpatient Medications  Medication Sig Dispense Refill   atorvastatin (LIPITOR) 20 MG tablet Take 20 mg  by mouth daily.     chlorthalidone (HYGROTON) 25 MG tablet Take  1/2     omeprazole (PRILOSEC) 20 MG capsule Take 20 mg by mouth daily.     ondansetron (ZOFRAN) 8 MG tablet      Potassium Chloride ER 20 MEQ TBCR      SUMAtriptan (IMITREX) 100 MG tablet Take 1 tablet with 600 mg of ibuprofen at the first sign of headache, may repeat in Imitrex only in 2 hours if needed.  Max 200/24hrs.     Vitamin D, Ergocalciferol, (DRISDOL) 1.25 MG (50000 UNIT) CAPS capsule TAKE 1 CAPSULE BY MOUTH ONE TIME PER WEEK 12 capsule 2   No current facility-administered medications for this visit.    REVIEW OF SYSTEMS:   .10 Point review of Systems was done is negative except as noted above.  PHYSICAL EXAMINATION: ECOG PERFORMANCE STATUS: 1 - Symptomatic but completely ambulatory  Telehealth visit 04/16/2021  LABORATORY DATA:  I have reviewed the data as listed  . CBC Latest Ref Rng & Units 04/09/2021 03/10/2020 03/10/2020  WBC 4.0 - 10.5 K/uL 6.2 5.0 4.1  Hemoglobin 13.0 - 17.0 g/dL 12.3(L) 13.2 13.1  Hematocrit 39.0 - 52.0 % 36.5(L) 40.8 40.1  Platelets 150 - 400 K/uL 164 183 155    . CMP Latest Ref Rng & Units 04/09/2021 03/10/2020 12/12/2019  Glucose 70 - 99 mg/dL 73 72 84  BUN 8 - 23 mg/dL  $'13 13 6  'd$ Creatinine 0.61 - 1.24 mg/dL 0.97 1.04 1.16  Sodium 135 - 145 mmol/L 146(H) 143 140  Potassium 3.5 - 5.1 mmol/L 3.9 3.6 4.0  Chloride 98 - 111 mmol/L 109 106 103  CO2 22 - 32 mmol/L $RemoveB'24 26 29  'EeqJDGjj$ Calcium 8.9 - 10.3 mg/dL 8.5(L) 9.0 9.9  Total Protein 6.5 - 8.1 g/dL 8.0 8.3(H) 8.0  Total Bilirubin 0.3 - 1.2 mg/dL 0.8 1.1 1.0  Alkaline Phos 38 - 126 U/L 51 63 73  AST 15 - 41 U/L 29 36 44(H)  ALT 0 - 44 U/L 16 27 34   03/10/2020 Bone Marrow Bx Report ([id number]):    03/10/2020    RADIOGRAPHIC STUDIES: I have personally reviewed the radiological images as listed and agreed with the findings in the report. No results found.   ASSESSMENT & PLAN:   63 with   1) IgG kappa monoclonal  paraproteinemia -likely from MGUS SPEP with M spike of 1.4g/dl -04/10/2019 CT C/A/P ([id number]) ([id number]) revealed  "1. No CT evidence of malignancy in the chest, abdomen, or pelvis. No lymphadenopathy. 2. Hepatic steatosis. 3. Descending and sigmoid diverticulosis without evidence of diverticulitis. 4. Mild, diffusely sclerotic appearance of the skeleton without focal lesion noted, of uncertain significance, most commonly seen with renal osteodystrophy."  -06/05/2019 Bone Scan Whole Body ([id number]) revealed "Normal bone scan." 2) Mild drop in hgb with mild borderline anemia  PLAN: -Discussed pt labwork today, 04/09/2021 -CBC hemoglobin of 12.3, WBC count of 6.2k platelets of 164k -CMP unremarkable creatinine of 0.97 no hypercalcemia. -Myeloma panel shows stable M protein of 1.3 that remains unchanged over the last year (was 1.5 g/dL on 03/10/2020). -No evidence of progression of MGUS to multiple myeloma at this time based on his labs and clinical symptoms. -Recommend pt stay up-to-date with age-appropriate vaccinations. Pt will receive Pneumonia and Shingles vaccines with PCP.  -Continue vitamin D replacement  FOLLOW UP: Phone visit with Dr. Irene Limbo in 6 months Labs 1 week prior to phone visit  All of the patient's questions were answered with apparent satisfaction. The patient knows to call the clinic with any problems, questions or concerns.    [name] MD [place] [facility] Hematology/Oncology Physician [facility]

## 2021-06-01 ENCOUNTER — Emergency Department (HOSPITAL_COMMUNITY): Payer: BC Managed Care – PPO

## 2021-06-01 ENCOUNTER — Inpatient Hospital Stay (HOSPITAL_COMMUNITY)
Admission: EM | Admit: 2021-06-01 | Discharge: 2021-06-03 | DRG: 074 | Disposition: A | Discharge status: Home / Self Care | Payer: BC Managed Care – PPO | Attending: Neurology | Admitting: Neurology

## 2021-06-01 ENCOUNTER — Other Ambulatory Visit: Payer: Self-pay

## 2021-06-01 ENCOUNTER — Encounter (HOSPITAL_COMMUNITY): Payer: Self-pay | Admitting: Emergency Medicine

## 2021-06-01 DIAGNOSIS — Z8673 Personal history of transient ischemic attack (TIA), and cerebral infarction without residual deficits: Secondary | ICD-10-CM | POA: Diagnosis not present

## 2021-06-01 DIAGNOSIS — M21331 Wrist drop, right wrist: Secondary | ICD-10-CM | POA: Diagnosis present

## 2021-06-01 DIAGNOSIS — G5631 Lesion of radial nerve, right upper limb: Principal | ICD-10-CM | POA: Diagnosis present

## 2021-06-01 DIAGNOSIS — D472 Monoclonal gammopathy: Secondary | ICD-10-CM | POA: Diagnosis not present

## 2021-06-01 DIAGNOSIS — R531 Weakness: Secondary | ICD-10-CM | POA: Diagnosis not present

## 2021-06-01 DIAGNOSIS — E7800 Pure hypercholesterolemia, unspecified: Secondary | ICD-10-CM

## 2021-06-01 DIAGNOSIS — I639 Cerebral infarction, unspecified: Secondary | ICD-10-CM | POA: Diagnosis not present

## 2021-06-01 DIAGNOSIS — E785 Hyperlipidemia, unspecified: Secondary | ICD-10-CM | POA: Diagnosis not present

## 2021-06-01 DIAGNOSIS — I10 Essential (primary) hypertension: Secondary | ICD-10-CM

## 2021-06-01 DIAGNOSIS — R200 Anesthesia of skin: Secondary | ICD-10-CM | POA: Diagnosis not present

## 2021-06-01 DIAGNOSIS — I6389 Other cerebral infarction: Secondary | ICD-10-CM | POA: Diagnosis not present

## 2021-06-01 HISTORY — DX: Hyperlipidemia, unspecified: E78.5

## 2021-06-01 HISTORY — DX: Essential (primary) hypertension: I10

## 2021-06-01 LAB — CBC
HCT: 37.7 % — ABNORMAL LOW (ref 39.0–52.0)
Hemoglobin: 13 g/dL (ref 13.0–17.0)
MCH: 35.9 pg — ABNORMAL HIGH (ref 26.0–34.0)
MCHC: 34.5 g/dL (ref 30.0–36.0)
MCV: 104.1 fL — ABNORMAL HIGH (ref 80.0–100.0)
Platelets: 193 10*3/uL (ref 150–400)
RBC: 3.62 MIL/uL — ABNORMAL LOW (ref 4.22–5.81)
RDW: 12.4 % (ref 11.5–15.5)
WBC: 5.6 10*3/uL (ref 4.0–10.5)
nRBC: 0 % (ref 0.0–0.2)

## 2021-06-01 LAB — DIFFERENTIAL
Abs Immature Granulocytes: 0.01 10*3/uL (ref 0.00–0.07)
Basophils Absolute: 0 10*3/uL (ref 0.0–0.1)
Basophils Relative: 1 %
Eosinophils Absolute: 0 10*3/uL (ref 0.0–0.5)
Eosinophils Relative: 1 %
Immature Granulocytes: 0 %
Lymphocytes Relative: 39 %
Lymphs Abs: 2.2 10*3/uL (ref 0.7–4.0)
Monocytes Absolute: 0.3 10*3/uL (ref 0.1–1.0)
Monocytes Relative: 6 %
Neutro Abs: 3 10*3/uL (ref 1.7–7.7)
Neutrophils Relative %: 53 %

## 2021-06-01 LAB — I-STAT CHEM 8, ED
BUN: 13 mg/dL (ref 8–23)
Calcium, Ion: 1.13 mmol/L — ABNORMAL LOW (ref 1.15–1.40)
Chloride: 103 mmol/L (ref 98–111)
Creatinine, Ser: 1.3 mg/dL — ABNORMAL HIGH (ref 0.61–1.24)
Glucose, Bld: 86 mg/dL (ref 70–99)
HCT: 41 % (ref 39.0–52.0)
Hemoglobin: 13.9 g/dL (ref 13.0–17.0)
Potassium: 3.6 mmol/L (ref 3.5–5.1)
Sodium: 142 mmol/L (ref 135–145)
TCO2: 28 mmol/L (ref 22–32)

## 2021-06-01 LAB — COMPREHENSIVE METABOLIC PANEL
ALT: 21 U/L (ref 0–44)
AST: 24 U/L (ref 15–41)
Albumin: 4.3 g/dL (ref 3.5–5.0)
Alkaline Phosphatase: 47 U/L (ref 38–126)
Anion gap: 9 (ref 5–15)
BUN: 12 mg/dL (ref 8–23)
CO2: 28 mmol/L (ref 22–32)
Calcium: 9.1 mg/dL (ref 8.9–10.3)
Chloride: 103 mmol/L (ref 98–111)
Creatinine, Ser: 1 mg/dL (ref 0.61–1.24)
GFR, Estimated: >60 mL/min (ref >60)
Glucose, Bld: 89 mg/dL (ref 70–99)
Potassium: 3.6 mmol/L (ref 3.5–5.1)
Sodium: 140 mmol/L (ref 135–145)
Total Bilirubin: 0.9 mg/dL (ref 0.3–1.2)
Total Protein: 7.7 g/dL (ref 6.5–8.1)

## 2021-06-01 LAB — PROTIME-INR
INR: 0.9 (ref 0.8–1.2)
Prothrombin Time: 11.9 seconds (ref 11.4–15.2)

## 2021-06-01 LAB — APTT: aPTT: 27 seconds (ref 24–36)

## 2021-06-01 LAB — MRSA NEXT GEN BY PCR, NASAL: MRSA by PCR Next Gen: NOT DETECTED

## 2021-06-01 MED ORDER — ACETAMINOPHEN 325 MG PO TABS: 650.0000 mg | ORAL_TABLET | ORAL | Status: DC | PRN

## 2021-06-01 MED ORDER — TENECTEPLASE FOR STROKE
0.2500 mg/kg | PACK | Freq: Once | INTRAVENOUS | Status: AC
  Administered 2021-06-01: 14 mg via INTRAVENOUS
  Filled 2021-06-01: qty 10

## 2021-06-01 MED ORDER — ATORVASTATIN CALCIUM 10 MG PO TABS
20.0000 mg | ORAL_TABLET | Freq: Every day | ORAL | Status: DC
  Administered 2021-06-02: 20 mg via ORAL
  Filled 2021-06-01: qty 2

## 2021-06-01 MED ORDER — PANTOPRAZOLE SODIUM 40 MG PO TBEC
40.0000 mg | DELAYED_RELEASE_TABLET | Freq: Every day | ORAL | Status: DC
  Administered 2021-06-02 – 2021-06-03 (×2): 40 mg via ORAL
  Filled 2021-06-01 (×2): qty 1

## 2021-06-01 MED ORDER — PANTOPRAZOLE SODIUM 40 MG IV SOLR: 40.0000 mg | Freq: Every day | INTRAVENOUS | Status: DC

## 2021-06-01 MED ORDER — ACETAMINOPHEN 160 MG/5ML PO SOLN: 650.0000 mg | ORAL | Status: DC | PRN

## 2021-06-01 MED ORDER — STROKE: EARLY STAGES OF RECOVERY BOOK: Freq: Once | Status: AC

## 2021-06-01 MED ORDER — CHLORHEXIDINE GLUCONATE CLOTH 2 % EX PADS
6.0000 | MEDICATED_PAD | Freq: Every day | CUTANEOUS | Status: DC
  Administered 2021-06-02: 6 via TOPICAL

## 2021-06-01 MED ORDER — SODIUM CHLORIDE 0.9% FLUSH
3.0000 mL | Freq: Once | INTRAVENOUS | Status: AC
  Administered 2021-06-01: 3 mL via INTRAVENOUS

## 2021-06-01 MED ORDER — SENNOSIDES-DOCUSATE SODIUM 8.6-50 MG PO TABS: 1.0000 | ORAL_TABLET | Freq: Every evening | ORAL | Status: DC | PRN

## 2021-06-01 MED ORDER — ACETAMINOPHEN 650 MG RE SUPP: 650.0000 mg | RECTAL | Status: DC | PRN

## 2021-06-01 NOTE — Code Documentation (Signed)
Stroke Response Nurse Documentation Code Documentation  Levander Katzenstein is a 61 y.o. male arriving to [facility] ED via Private Vehicle on 1/30 with past medical hx of HTN, HLD. On No antithrombotic. Code stroke was activated by ED.   Patient from home where he was LKW at 1800 and now complaining of right arm numbness and weakness.   Stroke team at the bedside on patient arrival. Labs drawn and patient cleared for CT by Lorin Roemhildt PA. Patient to CT with team. NIHSS 0, see documentation for details and code stroke times. Patient had weakness and numbness in his right arm and loss of fine motor skills in right hand. Right arm numbness and weakness improved but loss of fine motor function in his right hand is still present. Patient with resolving symptoms on exam. The following imaging was completed:  CT. Patient is a candidate for IV Thrombolytic due to fixed deficit. Patient is not a candidate for IR due to NO LVO.    Bedside handoff with ED RN Ysidro Evert.    Madelynn Done  Rapid Response RN

## 2021-06-01 NOTE — ED Provider Notes (Incomplete)
Patient is a 61 year old male with a history of hypertension and hyperlipidemia who is presenting today with sudden onset of weakness and numbness in his right arm.  No other areas involved.  He is awake alert and oriented.  No prior history of similar.  No pain in the arm and normal pulse.  Code stroke was initiated as patient's symptoms started less than 3 hours prior to arrival.  Dr. Lorrin Goodell was present with the neurology team upon patient's arrival.  I independently interpreted patient's CT which was negative for acute bleed.  TN K was discussed with the patient and risk factors and he chose to accept the TN K.  On repeat evaluation after receiving it he reports he still has numbness and weakness in the arm but might be a little better.  He denies any headache.  Blood pressure remained stable.  I personally evaluated the patient's labs and interpreted them.  CBC, Chem-8 without acute findings.  Patient will need admission to neuro ICU after receiving TN K for close monitoring.  Findings were discussed with the patient and his wife.  Given high morbidity and mortality of stroke patient meets inpatient criteria.  CRITICAL CARE Performed by: Lynsey Ange Total critical care time: 30 minutes Critical care time was exclusive of separately billable procedures and treating other patients. Critical care was necessary to treat or prevent imminent or life-threatening deterioration. Critical care was time spent personally by me on the following activities: development of treatment plan with patient and/or surrogate as well as nursing, discussions with consultants, evaluation of patient's response to treatment, examination of patient, obtaining history from patient or surrogate, ordering and performing treatments and interventions, ordering and review of laboratory studies, ordering and review of radiographic studies, pulse oximetry and re-evaluation of patient's condition.

## 2021-06-01 NOTE — ED Triage Notes (Signed)
Patient arrives ambulatory c/o right hand weakness and numbness. States he was working on his computer and at right before 6pm his symptoms started. Patient having difficulty gripping with right hand. Hx of HTN

## 2021-06-01 NOTE — ED Provider Notes (Signed)
[facility] Emergency Department Provider Note MRN:  [id number]  Arrival date & time: 06/02/21     Chief Complaint   Numbness and Code Stroke   History of Present Illness   Ryan Nixon is a 61 y.o. year-old male with a history of hyperlipidemia hypertension presenting to the ED with chief complaint of numbness patient states that he was working at his computer and at approximately 1800 he had right arm weakness and numbness.  Patient states that he has never had anything like this in the past.  The patient denies that he has had prior ICH states that he does not smoke and does not use any illicit substances.  Patient also states that he is not on blood thinners.  Patient reports that he has no other symptoms at this time.  Denies headache, other weakness or numbness.  No difficulty ambulating.  No difficulty with word finding or expression.  Review of Systems  A thorough review of systems was obtained and all systems are negative except as noted in the HPI and PMH.   Patient's Health History    Past Medical History:  Diagnosis Date   Headache    Hyperlipidemia    Hypertension     History reviewed. No pertinent surgical history.  History reviewed. No pertinent family history.  Social History   Socioeconomic History   Marital status: Married    Spouse name: Not on file   Number of children: Not on file   Years of education: Not on file   Highest education level: Not on file  Occupational History   Not on file  Tobacco Use   Smoking status: Never   Smokeless tobacco: Never  Substance and Sexual Activity   Alcohol use: Yes    Alcohol/week: 2.0 standard drinks    Types: 1 Cans of beer, 1 Glasses of wine per week    Comment: occasionally   Drug use: Not Currently   Sexual activity: Not on file  Other Topics Concern   Not on file  Social History Narrative   Not on file   Social Determinants of Health   Financial Resource Strain: Not on file   Food Insecurity: Not on file  Transportation Needs: Not on file  Physical Activity: Not on file  Stress: Not on file  Social Connections: Not on file  Intimate Partner Violence: Not on file     Physical Exam   Physical Exam Vitals and nursing note reviewed.  Constitutional:      Appearance: Normal appearance.  Cardiovascular:     Rate and Rhythm: Normal rate and regular rhythm.  Pulmonary:     Effort: Pulmonary effort is normal.     Breath sounds: Normal breath sounds.  Abdominal:     General: Abdomen is flat.     Palpations: Abdomen is soft.  Musculoskeletal:        General: No swelling, tenderness or deformity.  Skin:    Capillary Refill: Capillary refill takes less than 2 seconds.  Neurological:     Mental Status: He is alert and oriented to person, place, and time.     Cranial Nerves: No cranial nerve deficit.     Sensory: Sensory deficit (Right upper extremity (forearm)) present.     Motor: Weakness (Right upper extremity (forearm)) present.     Coordination: Coordination normal.     Gait: Gait normal.  Psychiatric:        Mood and Affect: Mood normal.      Diagnostic  and Interventional Summary    EKG Interpretation  Date/Time:    Ventricular Rate:    PR Interval:    QRS Duration:   QT Interval:    QTC Calculation:   R Axis:     Text Interpretation:         Labs Reviewed  CBC - Abnormal; Notable for the following components:      Result Value   RBC 3.62 (*)    HCT 37.7 (*)    MCV 104.1 (*)    MCH 35.9 (*)    All other components within normal limits  I-STAT CHEM 8, ED - Abnormal; Notable for the following components:   Creatinine, Ser 1.30 (*)    Calcium, Ion 1.13 (*)    All other components within normal limits  MRSA NEXT GEN BY PCR, NASAL  PROTIME-INR  APTT  DIFFERENTIAL  COMPREHENSIVE METABOLIC PANEL  HIV ANTIBODY (ROUTINE TESTING W REFLEX)  HEMOGLOBIN A1C  LIPID PANEL  CBG MONITORING, ED    CT HEAD CODE STROKE WO CONTRAST  Final  Result    MR ANGIO HEAD WO CONTRAST    (Results Pending)  VAS US CAROTID    (Results Pending)  MR BRAIN WO CONTRAST    (Results Pending)    Medications  acetaminophen (TYLENOL) tablet 650 mg (has no administration in time range)    Or  acetaminophen (TYLENOL) 160 MG/5ML solution 650 mg (has no administration in time range)    Or  acetaminophen (TYLENOL) suppository 650 mg (has no administration in time range)  senna-docusate (Senokot-S) tablet 1 tablet (has no administration in time range)  atorvastatin (LIPITOR) tablet 20 mg (has no administration in time range)  pantoprazole (PROTONIX) EC tablet 40 mg (has no administration in time range)  Chlorhexidine Gluconate Cloth 2 % PADS 6 each (has no administration in time range)  sodium chloride flush (NS) 0.9 % injection 3 mL (3 mLs Intravenous Given 06/01/21 1933)  tenecteplase (TNKASE) injection for Stroke 14 mg (14 mg Intravenous Given 06/01/21 1933)   stroke: mapping our early stages of recovery book ( Does not apply Given 06/01/21 2122)     Procedures  /  Critical Care Procedures  ED Course and Medical Decision Making  Initial Impression and Ddx Is a 61 year old male who presents to the emergency department as a tier 1 stroke.  Last known normal reported above.  At the time of my initial evaluation the patient was also being evaluated by neurology.  Patient was immediately taken to the CT scanner given that he was protecting his airway and was hemodynamically stable.  Differential diagnosis includes resolving to the following: Electrolyte abnormality, acute stroke, hemorrhagic stroke.  High suspicion for acute ischemic stroke.  Past medical/surgical history that increases complexity of ED encounter: Hyperlipidemia, hypertension  Interpretation of Diagnostics The patient exhibited normal sinus rhythm on the cardiac monitor.  EKG showed normal sinus rhythm without acute ischemia or dysrhythmia.  CT head did reveal findings concerning  for acute ischemic stroke.  Mild cerebral edema noted.  Given the onset and character of the patient's finding neurology offered TNKase to the patient.  The patient agreed to this.  Following admission of DKA the patient will be admitted to the neurology service for further management.  The patient's metabolic work-up was largely unremarkable. Patient Reassessment and Ultimate Disposition/Management Admitted to neurology.  Please see her note for further details of the patient's care.  Patient management required discussion with the following services or consulting groups:  Neurology  Complexity of Problems Addressed Acute illness or injury that poses threat of life of bodily function  Additional Data Reviewed and Analyzed Further history obtained from: EMS on arrival  Factors Impacting ED Encounter Risk Consideration of hospitalization    Final Clinical Impressions(s) / ED Diagnoses     ICD-10-CM   1. Weakness  R53.1       ED Discharge Orders     None        Discharge Instructions Discussed with and Provided to Patient:   Discharge Instructions   None       Zachery Dakins, MD 06/02/21 0001    Blanchie Dessert, MD 06/02/21 2318

## 2021-06-01 NOTE — ED Provider Triage Note (Signed)
Emergency Medicine Provider Triage Evaluation Note  Jatavis Malek , a 61 y.o. male  was evaluated in triage.  Pt complains of right hand weakness and numbness.  Patient states his symptoms started abruptly at 6 PM.  He cannot make a fist with that hand.  He has no history of strokes.  No other symptoms.  Review of Systems  Positive: Right hand weakness and numbness Negative: Facial droop, speech slurred  Physical Exam  BP 131/80    Pulse 84    Temp 98 F (36.7 C) (Oral)    Resp 14    Ht 5\' 7"  (1.702 m)    Wt 56.7 kg    SpO2 100%    BMI 19.58 kg/m  Gen:   Awake, no distress   Resp:  Normal effort  MSK:   Moves extremities without difficulty  Other:    Medical Decision Making  Medically screening exam initiated at 7:11 PM.  Appropriate orders placed.  Hanley Ben was informed that the remainder of the evaluation will be completed by another provider, this initial triage assessment does not replace that evaluation, and the importance of remaining in the ED until their evaluation is complete.  Consulted with attending physician Dr. Leonia Corona, recommended I consult neurology whether or not this patient meets criteria for code stroke.  Neurologist consulted who recommended we activate code stroke.   Kateri Plummer, Hershal Coria 06/01/21 1915

## 2021-06-01 NOTE — ED Notes (Signed)
Triage PA notified of patients symptoms at this time.

## 2021-06-01 NOTE — H&P (Addendum)
NEUROLOGY CONSULTATION NOTE   Date of service: June 01, 2021 Patient Name: Ryan Nixon MRN:  [id number] DOB:  14-Jun-1960 Reason for consult: "R hand weakness" Requesting Provider: Blanchie Dessert, MD _ _ _   _ __   _ __ _ _  __ __   _ __   __ _  History of Present Illness  Ryan Nixon is a 61 y.o. male with PMH significant for  Headache on sumatriptan PRN, Hyperlipidemia, and Hypertension who presents with sudden onset right hand weakness and numbness.  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  He was working on his computer when he had sudden onset right hand weakness and numbness that started at 1800 on 06/01/2021.  He has never had anything this in the past.  Symptoms are persistent so he came to the emergency department with a stroke code was activated.  No prior history of ICH, does not smoke.  No significant alcohol intake.  No recent surgeries, no recent trauma, no history of MI, no history of aortic dissection.  He is not on any blood thinning medications.  LKW: 1800 on 06/01/2021. mRS: 0 tNKASE: Offered as symptom felt to be significantly disabling for patient despite low NIH stroke scale. He is right handed and as an Chartered certified accountant spends most of his working days on a computer. We discussed risks and benefits of tNKASE including risks of ICH that can also result in death and patient agreed to tNKASE. Decision to offer tNKASE made at 1926, pharmacy notified at 1927. Thrombectomy: Not offered, symptosm too mild. NIHSS components Score: Comment  1a Level of Conscious 0[x]  1[]  2[]  3[]      1b LOC Questions 0[x]  1[]  2[]       1c LOC Commands 0[x]  1[]  2[]       2 Best Gaze 0[x]  1[]  2[]       3 Visual 0[x]  1[]  2[]  3[]      4 Facial Palsy 0[x]  1[]  2[]  3[]      5a Motor Arm - left 0[]  1[]  2[]  3[]  4[]  UN[]    5b Motor Arm - Right 0[x]  1[]  2[]  3[]  4[]  UN[]    6a Motor Leg - Left 0[x]  1[]  2[]  3[]  4[]  UN[]    6b Motor Leg - Right 0[x]  1[]  2[]   3[]  4[]  UN[]    7 Limb Ataxia 0[x]  1[]  2[]  3[]  UN[]     8 Sensory 0[]  1[x]  2[]  UN[]      9 Best Language 0[x]  1[]  2[]  3[]      10 Dysarthria 0[x]  1[]  2[]  UN[]      11 Extinct. and Inattention 0[x]  1[]  2[]       TOTAL:      ROS   Constitutional Denies weight loss, fever and chills.   HEENT Denies changes in vision and hearing.   Respiratory Denies SOB and cough.   CV Denies palpitations and CP   GI Denies abdominal pain, nausea, vomiting and diarrhea.   GU Denies dysuria and urinary frequency.   MSK Denies myalgia and joint pain.   Skin Denies rash and pruritus.   Neurological Denies headache and syncope.   Psychiatric Denies recent changes in mood. Denies anxiety and depression.    Past History   Past Medical History:  Diagnosis Date   Headache    Hyperlipidemia    Hypertension    History reviewed. No pertinent surgical history. History reviewed. No pertinent family history. Social History   Socioeconomic History   Marital status: Married  Spouse name: Not on file   Number of children: Not on file   Years of education: Not on file   Highest education level: Not on file  Occupational History   Not on file  Tobacco Use   Smoking status: Never   Smokeless tobacco: Never  Substance and Sexual Activity   Alcohol use: Yes    Alcohol/week: 2.0 standard drinks    Types: 1 Cans of beer, 1 Glasses of wine per week    Comment: occasionally   Drug use: Not Currently   Sexual activity: Not on file  Other Topics Concern   Not on file  Social History Narrative   Not on file   Social Determinants of Health   Financial Resource Strain: Not on file  Food Insecurity: Not on file  Transportation Needs: Not on file  Physical Activity: Not on file  Stress: Not on file  Social Connections: Not on file   No Known Allergies  Medications  (Not in a hospital admission)    Vitals   Vitals:   06/01/21 1832 06/01/21 1843  BP: 131/80   Pulse: 84   Resp: 14    Temp: 98 F (36.7 C)   TempSrc: Oral   SpO2: 100%   Weight:  56.7 kg  Height:  5\' 7"  (1.702 m)     Body mass index is 19.58 kg/m.  Physical Exam   General: Laying comfortably in bed; in no acute distress.  HENT: Normal oropharynx and mucosa. Normal external appearance of ears and nose.  Neck: Supple, no pain or tenderness  CV: No JVD. No peripheral edema.  Pulmonary: Symmetric Chest rise. Normal respiratory effort.  Abdomen: Soft to touch, non-tender.  Ext: No cyanosis, edema, or deformity  Skin: No rash. Normal palpation of skin.   Musculoskeletal: Normal digits and nails by inspection. No clubbing.   Neurologic Examination  Mental status/Cognition: Alert, oriented to self, place, month and year, good attention.  Speech/language: Fluent, comprehension intact, object naming intact, repetition intact.  Cranial nerves:   CN II Pupils equal and reactive to light, no VF deficits    CN III,IV,VI EOM intact, no gaze preference or deviation, no nystagmus    CN V normal sensation in V1, V2, and V3 segments bilaterally    CN VII no asymmetry, no nasolabial fold flattening    CN VIII normal hearing to speech    CN IX & X normal palatal elevation, no uvular deviation    CN XI 5/5 head turn and 5/5 shoulder shrug bilaterally    CN XII midline tongue protrusion    Motor:  Muscle bulk: poor, tone normal Mvmt Root Nerve  Muscle Right Left Comments  SA C5/6 Ax Deltoid 4+ 4+   EF C5/6 Mc Biceps 5 5   EE C6/7/8 Rad Triceps 5 5   WF C6/7 Med FCR 4 5   WE C7/8 PIN ECU 2 5   F Ab C8/T1 U ADM/FDI 1 5   HF L1/2/3 Fem Illopsoas 5 5   KE L2/3/4 Fem Quad 5 5   DF L4/5 D Peron Tib Ant 5 5   PF S1/2 Tibial Grc/Sol 5 5    Reflexes:  Right Left Comments  Pectoralis      Biceps (C5/6) 1 1   Brachioradialis (C5/6) 1 1    Triceps (C6/7) 1 1    Patellar (L3/4) 1 1    Achilles (S1)      Hoffman      Plantar  Jaw jerk    Sensation:  Light touch Decreased in R hand.   Pin prick     Temperature    Vibration   Proprioception    Coordination/Complex Motor:  - Finger to Nose intact on the lft, unable to do with R Hand. - Heel to shin intact BL - Rapid alternating movement are normal - Gait: Stride length normal. Arm swing poor. Base width narrow.  Labs   CBC:  Recent Labs  Lab 06/01/21 1918  WBC 5.6  NEUTROABS 3.0  HGB 13.0  HCT 37.7*  MCV 104.1*  PLT 283    Basic Metabolic Panel:  Lab Results  Component Value Date   NA 146 (H) 04/09/2021   K 3.9 04/09/2021   CO2 24 04/09/2021   GLUCOSE 73 04/09/2021   BUN 13 04/09/2021   CREATININE 0.97 04/09/2021   CALCIUM 8.5 (L) 04/09/2021   GFRNONAA >60 04/09/2021   GFRAA >60 12/12/2019   Lipid Panel: No results found for: LDLCALC HgbA1c: No results found for: HGBA1C Urine Drug Screen: No results found for: LABOPIA, COCAINSCRNUR, LABBENZ, AMPHETMU, THCU, LABBARB  Alcohol Level No results found for: [place]  CT Head without contrast(Personally reviewed): CTH was negative for a large hypodensity concerning for a large territory infarct or hyperdensity concerning for an ICH  MR Angio head without contrast and Carotid Duplex BL(Personally reviewed): pending  MRI Brain(Personally reviewed): pending Impression   Ryan Nixon is a 61 y.o. R handed male with PMH significant for Headache on sumatriptan PRN, Hyperlipidemia, and Hypertension who presents with sudden onset right hand weakness and numbness who presents with sudden onset R hand weakness and numbness . Despite low NIHSS, symptoms were disabling and tNKASE offered. Patient understood risks and benefits on tNKASE and consented.  High concern for a small vessel stroke. Unlikely for carpal tunnel to be sudden onset and not improving, carpal tunnel also would not cause weakness of wrist extension. Unlikely for paraproteinemic neuropathy to present so rapidly.  Primary Diagnosis:  Cerebral infarction, unspecified.  Secondary Diagnosis: Essential (primary)  hypertension  Recommendations  Plan: - Frequent NeuroChecks for post tPA care per stroke unit protocol: - Initial CTH demonstrated no acute hemorrhage or mass - MRI Brain - pending - CTA - pending - TTE - pending - Lipid Panel: LDL - pending.  - Statin: copntinue home Atorvastatin 20mg  daily. - HbA1c: pending. - Antithrombotic: Start ASA 81 mg daily if 24 h CTH does not show acute hemorrhage - DVT prophylaxis: SCDs. Pharmacologic prophylaxis if 24 h CTH does not demonstrate acute hemorrhage - Systolic Blood Pressure goal: < 180 mm Hg - Telemetry monitoring for arrhythmia: 72 hours - Swallow screen - ordered - PT/OT/SLP consults  HTN: - goal BP as above, hold home medications.  HLD: - continue home Atorvastatin, LDL is pending.   ___________________________________________________________________  This patient is critically ill and at significant risk of neurological worsening, death and care requires constant monitoring of vital signs, hemodynamics,respiratory and cardiac monitoring, neurological assessment, discussion with family, other specialists and medical decision making of high complexity. I spent 60 minutes of neurocritical care time  in the care of  this patient. This was time spent independent of any time provided by nurse practitioner or PA.  Donnetta Simpers [facility] Pager Number [phone] 06/01/2021  8:50 PM   Thank you for the opportunity to take part in the care of this patient. If you have any further questions, please contact the neurology consultation attending.  Signed,  Donnetta Simpers [facility] Pager Number [phone] _ _ _   _ __   _ __ _ _  __ __   _ __   __ _

## 2021-06-01 NOTE — ED Notes (Signed)
RN made aware that pt is a code stroke.  Charge RN made aware.  CT informed, pt going to CT.  RN transported to CT 1.

## 2021-06-02 ENCOUNTER — Inpatient Hospital Stay (HOSPITAL_COMMUNITY): Payer: BC Managed Care – PPO

## 2021-06-02 DIAGNOSIS — I639 Cerebral infarction, unspecified: Secondary | ICD-10-CM

## 2021-06-02 DIAGNOSIS — I6389 Other cerebral infarction: Secondary | ICD-10-CM | POA: Diagnosis not present

## 2021-06-02 LAB — ECHOCARDIOGRAM COMPLETE
AR max vel: 3.38 cm2
AV Area VTI: 3.15 cm2
AV Area mean vel: 3.23 cm2
AV Mean grad: 3 mmHg
AV Peak grad: 5.4 mmHg
Ao pk vel: 1.16 m/s
Area-P 1/2: 2.44 cm2
Height: 67 in
S' Lateral: 2.8 cm
Weight: 2000 oz

## 2021-06-02 LAB — LIPID PANEL
Cholesterol: 195 mg/dL (ref 0–200)
HDL: 62 mg/dL (ref >40)
LDL Cholesterol: 117 mg/dL — ABNORMAL HIGH (ref 0–99)
Total CHOL/HDL Ratio: 3.1 RATIO
Triglycerides: 79 mg/dL (ref <150)
VLDL: 16 mg/dL (ref 0–40)

## 2021-06-02 LAB — HEMOGLOBIN A1C
Hgb A1c MFr Bld: 5.2 % (ref 4.8–5.6)
Mean Plasma Glucose: 102.54 mg/dL

## 2021-06-02 LAB — HIV ANTIBODY (ROUTINE TESTING W REFLEX): HIV Screen 4th Generation wRfx: NONREACTIVE

## 2021-06-02 MED ORDER — ATORVASTATIN CALCIUM 80 MG PO TABS
80.0000 mg | ORAL_TABLET | Freq: Every day | ORAL | Status: DC
  Administered 2021-06-03: 80 mg via ORAL
  Filled 2021-06-02: qty 1

## 2021-06-02 MED ORDER — ATORVASTATIN CALCIUM 40 MG PO TABS: 40.0000 mg | ORAL_TABLET | Freq: Every day | ORAL | Status: DC

## 2021-06-02 NOTE — Progress Notes (Signed)
SLP Cancellation Note  Patient Details Name: Ryan Nixon MRN: [id number] DOB: October 29, 1960   Cancelled treatment:        Pt in MRI when attempted cognitive eval. Will continue efforts   Houston Siren 06/02/2021, 3:06 PM

## 2021-06-02 NOTE — Progress Notes (Signed)
PT Cancellation Note  Patient Details Name: Ryan Nixon MRN: [id number] DOB: [birth year]   Cancelled Treatment:    Reason Eval/Treat Not Completed: Active bedrest order (will evaluate when activity orders updated)  Leighton Roach, [place]  Pager [phone] Office [place] 06/02/2021, 8:17 AM

## 2021-06-02 NOTE — Progress Notes (Signed)
OT Cancellation Note  Patient Details Name: Ryan Nixon MRN: [id number] DOB: Sep 05, 1960   Cancelled Treatment:    Reason Eval/Treat Not Completed: Active bedrest order. Will assess when activity orders updated. thanks  [place], OT/L   Acute OT Clinical Specialist [place] Pager [phone] Office [phone]  06/02/2021, 8:39 AM

## 2021-06-02 NOTE — Progress Notes (Signed)
Provided patient with education document on Radial Nerve Palsy.

## 2021-06-02 NOTE — Progress Notes (Signed)
°  Transition of Care [facility]) Screening Note   Patient Details  Name: Ryan Nixon Date of Birth: 12/29/1960   Transition of Care [facility]) CM/SW Contact:    Benard Halsted, LCSW Phone Number: 06/02/2021, 9:55 AM    Transition of Care Department [facility]) has reviewed patient and no TOC needs have been identified at this time. We will continue to monitor patient advancement through interdisciplinary progression rounds. If new patient transition needs arise, please place a TOC consult.

## 2021-06-02 NOTE — Progress Notes (Addendum)
STROKE TEAM PROGRESS NOTE   INTERVAL HISTORY Patient is seen in his room with no family at the bedside.  Yesterday, he was working on his computer and noticed sudden onset right hand weakness and numbness and presented to the ED.  TNK was given, but patient's symptoms continued.  Blood pressure adequately controlled.  Vitals:   06/02/21 1153 06/02/21 1200 06/02/21 1300 06/02/21 1400  BP:  130/90 114/74 107/76  Pulse:  72 60 (!) 57  Resp:  17 12 12   Temp: 98.3 F (36.8 C)     TempSrc: Oral     SpO2:  97% 95% 98%  Weight:      Height:       CBC:  Recent Labs  Lab 06/01/21 1918 06/01/21 1935  WBC 5.6  --   NEUTROABS 3.0  --   HGB 13.0 13.9  HCT 37.7* 41.0  MCV 104.1*  --   PLT 193  --    Basic Metabolic Panel:  Recent Labs  Lab 06/01/21 1918 06/01/21 1935  NA 140 142  K 3.6 3.6  CL 103 103  CO2 28  --   GLUCOSE 89 86  BUN 12 13  CREATININE 1.00 1.30*  CALCIUM 9.1  --    Lipid Panel:  Recent Labs  Lab 06/02/21 0415  CHOL 195  TRIG 79  HDL 62  CHOLHDL 3.1  VLDL 16  LDLCALC 117*   HgbA1c:  Recent Labs  Lab 06/02/21 0415  HGBA1C 5.2   Urine Drug Screen: No results for input(s): LABOPIA, COCAINSCRNUR, LABBENZ, AMPHETMU, THCU, LABBARB in the last 168 hours.  Alcohol Level No results for input(s): ETH in the last 168 hours.  IMAGING past 24 hours MR ANGIO HEAD WO CONTRAST  Result Date: 06/02/2021 CLINICAL DATA:  History of headaches, presenting with sudden onset right hand weakness and numbness EXAM: MRI HEAD WITHOUT CONTRAST MRA HEAD WITHOUT CONTRAST TECHNIQUE: Multiplanar, multi-echo pulse sequences of the brain and surrounding structures were acquired without intravenous contrast. Angiographic images of the Circle of Willis were acquired using MRA technique without intravenous contrast. COMPARISON:  There is no acute intracranial hemorrhage, extra-axial fluid collection, or acute infarct. Parenchymal volume is normal. The ventricles are normal in size. There  is patchy FLAIR signal abnormality in the subcortical and periventricular white matter likely reflecting sequela of mild chronic white matter microangiopathy. There is no suspicious parenchymal signal abnormality. There is no mass lesion. There is no midline shift. FINDINGS: MRI HEAD FINDINGS Brain: No acute infarction, hemorrhage, hydrocephalus, extra-axial collection or mass lesion. Vascular: Normal flow voids. Skull and upper cervical spine: Normal marrow signal. Sinuses/Orbits: The paranasal sinuses are clear. The globes and orbits are unremarkable. Other: None. MRA HEAD FINDINGS Anterior circulation: The intracranial ICAs are patent. The bilateral MCAs are patent. The bilateral ACAs are patent. The anterior communicating artery is normal. There is no aneurysm or AVM. Posterior circulation: The bilateral V4 segments are patent. PICA is identified bilaterally. The basilar artery is patent. The bilateral PCAs are patent. There is no aneurysm or AVM. Anatomic variants: None. IMPRESSION: 1. No acute intracranial pathology. 2. Mild chronic white matter microangiopathy. 3. Normal intracranial MRA. Electronically Signed   By: Valetta Mole M.D.   On: 06/02/2021 11:01   MR BRAIN WO CONTRAST  Result Date: 06/02/2021 CLINICAL DATA:  History of headaches, presenting with sudden onset right hand weakness and numbness EXAM: MRI HEAD WITHOUT CONTRAST MRA HEAD WITHOUT CONTRAST TECHNIQUE: Multiplanar, multi-echo pulse sequences of the brain and surrounding structures  were acquired without intravenous contrast. Angiographic images of the Circle of Willis were acquired using MRA technique without intravenous contrast. COMPARISON:  There is no acute intracranial hemorrhage, extra-axial fluid collection, or acute infarct. Parenchymal volume is normal. The ventricles are normal in size. There is patchy FLAIR signal abnormality in the subcortical and periventricular white matter likely reflecting sequela of mild chronic white  matter microangiopathy. There is no suspicious parenchymal signal abnormality. There is no mass lesion. There is no midline shift. FINDINGS: MRI HEAD FINDINGS Brain: No acute infarction, hemorrhage, hydrocephalus, extra-axial collection or mass lesion. Vascular: Normal flow voids. Skull and upper cervical spine: Normal marrow signal. Sinuses/Orbits: The paranasal sinuses are clear. The globes and orbits are unremarkable. Other: None. MRA HEAD FINDINGS Anterior circulation: The intracranial ICAs are patent. The bilateral MCAs are patent. The bilateral ACAs are patent. The anterior communicating artery is normal. There is no aneurysm or AVM. Posterior circulation: The bilateral V4 segments are patent. PICA is identified bilaterally. The basilar artery is patent. The bilateral PCAs are patent. There is no aneurysm or AVM. Anatomic variants: None. IMPRESSION: 1. No acute intracranial pathology. 2. Mild chronic white matter microangiopathy. 3. Normal intracranial MRA. Electronically Signed   By: Valetta Mole M.D.   On: 06/02/2021 11:01   CT HEAD CODE STROKE WO CONTRAST  Result Date: 06/01/2021 CLINICAL DATA:  Code stroke.  Numbness weakness in the right arm EXAM: CT HEAD WITHOUT CONTRAST TECHNIQUE: Contiguous axial images were obtained from the base of the skull through the vertex without intravenous contrast. RADIATION DOSE REDUCTION: This exam was performed according to the departmental dose-optimization program which includes automated exposure control, adjustment of the mA and/or kV according to patient size and/or use of iterative reconstruction technique. COMPARISON:  None. FINDINGS: Brain: No evidence of acute cortical infarction, hemorrhage, cerebral edema, mass, mass effect, or midline shift. Mild hypodensity in the posterior left left-greater-than-right corona radiata (series 3, image 20), favored to represent the sequela of chronic small vessel ischemic disease, although this is of indeterminate acuity.  Ventricles and sulci are normal for age. No extra-axial fluid collection. Vascular: No hyperdense vessel or unexpected calcification. Skull: Normal. Negative for fracture or focal lesion. Sinuses/Orbits: No acute finding. Other: The mastoid air cells are well aerated. ASPECTS [facility] Stroke Program Early CT Score) - Ganglionic level infarction (caudate, lentiform nuclei, internal capsule, insula, M1-M3 cortex): 7 - Supraganglionic infarction (M4-M6 cortex): 3 Total score (0-10 with 10 being normal): 10 IMPRESSION: 1. No acute hemorrhage or large cortical infarction. 2. Hypodensity in the left-greater-than-right corona radiata, favored to be the result of chronic small vessel ischemic disease, although technically of indeterminate acuity. 3. ASPECTS is 10 Code stroke imaging results were communicated on 06/01/2021 at 7:29 pm to provider Dr. Lorrin Goodell via telephone, who verbally acknowledged these results. Electronically Signed   By: Merilyn Baba M.D.   On: 06/01/2021 19:32   VAS US CAROTID  Result Date: 06/02/2021 Carotid Arterial Duplex Study Patient Name:  Ryan Nixon  Date of Exam:   06/02/2021 Medical Rec #: [id number]     Accession #:    [id number] Date of Birth: Jan 10, 1961    Patient Gender: M Patient Age:   48 years Exam Location:  [facility] Procedure:      VAS US CAROTID Referring Phys: Alferd Patee [facility] --------------------------------------------------------------------------------  Indications:       CVA. Risk Factors:      Hypertension, hyperlipidemia. Comparison Study:  No prior studies. Performing Technologist: Darlin Coco RDMS, RVT  Examination Guidelines: A complete evaluation includes B-mode imaging, spectral Doppler, color Doppler, and power Doppler as needed of all accessible portions of each vessel. Bilateral testing is considered an integral part of a complete examination. Limited examinations for reoccurring indications may be performed as noted.  Right Carotid Findings:  +----------+--------+--------+--------+------------------+------------------+             PSV cm/s EDV cm/s Stenosis Plaque Description Comments            +----------+--------+--------+--------+------------------+------------------+  CCA Prox   97       19                                                       +----------+--------+--------+--------+------------------+------------------+  CCA Distal 86       19                                   intimal thickening  +----------+--------+--------+--------+------------------+------------------+  ICA Prox   78       24                                   intimal thickening  +----------+--------+--------+--------+------------------+------------------+  ICA Distal 49       18                                                       +----------+--------+--------+--------+------------------+------------------+  ECA        97       14                                                       +----------+--------+--------+--------+------------------+------------------+ +----------+--------+-------+----------------+-------------------+             PSV cm/s EDV cms Describe         Arm Pressure (mmHG)  +----------+--------+-------+----------------+-------------------+  Subclavian 83               Multiphasic, WNL                      +----------+--------+-------+----------------+-------------------+ +---------+--------+--+--------+--+---------+  Vertebral PSV cm/s 61 EDV cm/s 26 Antegrade  +---------+--------+--+--------+--+---------+  Left Carotid Findings: +----------+--------+--------+--------+------------------+------------------+             PSV cm/s EDV cm/s Stenosis Plaque Description Comments            +----------+--------+--------+--------+------------------+------------------+  CCA Prox   118      25                                                       +----------+--------+--------+--------+------------------+------------------+  CCA Distal 93       20  intimal thickening  +----------+--------+--------+--------+------------------+------------------+  ICA Prox   66       23                                   intimal thickening  +----------+--------+--------+--------+------------------+------------------+  ICA Distal 64       23                                                       +----------+--------+--------+--------+------------------+------------------+  ECA        73       10                                                       +----------+--------+--------+--------+------------------+------------------+ +----------+--------+--------+----------------+-------------------+             PSV cm/s EDV cm/s Describe         Arm Pressure (mmHG)  +----------+--------+--------+----------------+-------------------+  Subclavian 95                Multiphasic, WNL                      +----------+--------+--------+----------------+-------------------+ +---------+--------+--+--------+--+---------+  Vertebral PSV cm/s 37 EDV cm/s 10 Antegrade  +---------+--------+--+--------+--+---------+   Summary: Right Carotid: The extracranial vessels were near-normal with only minimal wall                thickening or plaque. Left Carotid: The extracranial vessels were near-normal with only minimal wall               thickening or plaque. Vertebrals:  Bilateral vertebral arteries demonstrate antegrade flow. Subclavians: Normal flow hemodynamics were seen in bilateral subclavian              arteries. *See table(s) above for measurements and observations.     Preliminary     PHYSICAL EXAM General:  Alert, well-developed, well-nourished pleasant middle-age African-American male patient in no acute distress   NEURO:  Mental Status: AA&Ox3  Speech/Language: speech is without dysarthria or aphasia.    Cranial Nerves:  II: PERRL.   III, IV, VI: EOMI. Eyelids elevate symmetrically.  V: Sensation is intact to light touch and symmetrical to face.  VII: Smile is symmetrical.   VIII: hearing intact to voice. IX, X: Phonation is normal.  XII: tongue is midline without fasciculations. Motor: 5/5 strength to LUE and BLE.  Right arm strength at shoulder and elbow 5/5.  Notable weakness in right hand when wrist is flexed but significantly improved strength when wrist is extended.  From.  Good strength and long flexors of the fingers and right thumb and intrinsic hand muscles. Sensation- Intact to light touch bilaterally.   Gait- deferred   ASSESSMENT/PLAN Ryan Nixon is a 61 y.o. male with history of migraines, HLD and HTN presenting with sudden onset right hand weakness and numbness and presented to the ED.  TNK was given, but patient's symptoms continued.  Patient's hand strength improves dramatically with extension of wrist, and MRI is negative for stroke.  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  Right wrist drop from  radial nerve palsy mimicking stroke symptoms: S/p IV TNK administration Code Stroke CT head No acute abnormality. Small vessel disease. ASPECTS 10.    MRI  No acute intracranial pathology MRA  Normal intracranial MRA Carotid Doppler  minimal thickening or plaque in bilateral carotids 2D Echo pending LDL 117 HgbA1c 5.2 VTE prophylaxis - SCDs    Diet   Diet Heart Room service appropriate? Yes with Assist; Fluid consistency: Thin   No antithrombotic prior to admission, now on No antithrombotic as he is less than 24 hours post TNK. OT for hand brace  Therapy recommendations:  pending Disposition:  pending  Hypertension Home meds:  none Stable Keep BP <180/105 Long-term BP goal normotensive  Hyperlipidemia Home meds:  atorvastatin 40 mg daily, increased to 80 mg daily,  LDL 117, goal < 70 Continue statin at discharge  Other Stroke Risk Factors Migraines  Other Active Problems none  Hospital day # [place] , MSN, AGACNP-BC [facility] See Amion for schedule and pager information 06/02/2021 2:34 PM   I have personally obtained history,examined this patient, reviewed notes, independently viewed imaging studies, participated in medical decision making and plan of care.ROS completed by me personally and pertinent positives fully documented  I have made any additions or clarifications directly to the above note. Agree with note above.  Patient presented with sudden onset of right hand weakness and some paresthesias felt to be from MCA branch infarct and treated with IV TNKase without any improvement.  MRI is negative for stroke and neurological exam suggests mostly wrist extensor weakness with relatively preserved strength in the intrinsic hand muscles and finger flexors when the wrist is extended suggesting radial nerve palsy.  Recommend mobilize out of bed.  Close neurological monitoring and strict blood pressure control as per post thrombolytic protocol.  Occupational Therapy consult.  Right wrist extension splint.  He will need outpatient EMG nerve conduction study.This patient is critically ill and at significant risk of neurological worsening, death and care requires constant monitoring of vital signs, hemodynamics,respiratory and cardiac monitoring, extensive review of multiple databases, frequent neurological assessment, discussion with family, other specialists and medical decision making of high complexity.I have made any additions or clarifications directly to the above note.This critical care time does not reflect procedure time, or teaching time or supervisory time of PA/NP/Med Resident etc but could involve care discussion time.  I spent 30 minutes of neurocritical care time  in the care of  this patient.      Antony Contras, MD Medical Director [facility] Stroke Center Pager: [phone] 06/02/2021 3:45 PM   To contact Stroke Continuity provider, please refer to [email or web address] After hours, contact General Neurology

## 2021-06-02 NOTE — Evaluation (Signed)
Physical Therapy Evaluation and Discharge Patient Details Name: Carvin Almas MRN: [id number] DOB: 04-Nov-1960 Today's Date: 06/02/2021  History of Present Illness  Pt is 61 yo male who presented with R UE numbness and weakness.CT showed hypodensity in the L>R corona radiata. Pt received TNK but symptoms continued. Pt diagnosed with radial nerve palsy.   PMH: HTN, HLD.  Clinical Impression  Patient evaluated by Physical Therapy with no further acute PT needs identified. All education has been completed and the patient has no further questions. 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 Pt reports he had finished work for the day and went downstairs and was cooking dinner, having just placed a pan in the oven when he had sudden loss of R wrist strength. Pt independent with mobility with no other deficits outside of the radial nerve palsy. Pt will need acute OT and likely outpt OT as well but not further PT needs.  See below for any follow-up Physical Therapy or equipment needs. PT is signing off. Thank you for this referral.        Recommendations for follow up therapy are one component of a multi-disciplinary discharge planning process, led by the attending physician.  Recommendations may be updated based on patient status, additional functional criteria and insurance authorization.  Follow Up Recommendations No PT follow up    Assistance Recommended at Discharge None  Patient can return home with the following  Assistance with cooking/housework;A little help with bathing/dressing/bathroom    Equipment Recommendations None recommended by PT  Recommendations for Other Services  OT consult    Functional Status Assessment Patient has had a recent decline in their functional status and demonstrates the ability to make significant improvements in function in a reasonable and predictable amount of time.     Precautions / Restrictions Precautions Precautions:  None Restrictions Weight Bearing Restrictions: No      Mobility  Bed Mobility Overal bed mobility: Independent                  Transfers Overall transfer level: Independent                      Ambulation/Gait Ambulation/Gait assistance: Independent Gait Distance (Feet): 500 Feet Assistive device: None Gait Pattern/deviations: WFL(Within Functional Limits) Gait velocity: WFL Gait velocity interpretation: >4.37 ft/sec, indicative of normal walking speed   General Gait Details: ambulated safely with normal pace, holding R hand close to body, discussed attempting to let it move naturally but sensory changes making that a challenge currently  Stairs Stairs: Yes Stairs assistance: Independent Stair Management: No rails, Alternating pattern, Forwards Number of Stairs: 10 General stair comments: pt ascended without use of rail, descended with L rail, independent and safe  Wheelchair Mobility    Modified Rankin (Stroke Patients Only)       Balance Overall balance assessment: Independent                                           Pertinent Vitals/Pain Pain Assessment Pain Assessment: No/denies pain    Home Living Family/patient expects to be discharged to:: Private residence Living Arrangements: Spouse/significant other Available Help at Discharge: Family;Available 24 hours/day Type of Home: House Home Access: Level entry     Alternate Level Stairs-Number of Steps: flight Home Layout: Two level Home Equipment: None Additional Comments: works from home  in IT. Wife works from home as well. Pt is a Firefighter    Prior Function Prior Level of Function : Independent/Modified Independent;Driving;Working/employed                     Hand Dominance   Dominant Hand: Right    Extremity/Trunk Assessment   Upper Extremity Assessment Upper Extremity Assessment: Defer to OT evaluation    Lower Extremity Assessment Lower  Extremity Assessment: Overall WFL for tasks assessed    Cervical / Trunk Assessment Cervical / Trunk Assessment: Normal  Communication   Communication: No difficulties  Cognition Arousal/Alertness: Awake/alert Behavior During Therapy: WFL for tasks assessed/performed Overall Cognitive Status: Within Functional Limits for tasks assessed                                          General Comments General comments (skin integrity, edema, etc.): discussed finger and wrist ROM and positioning as well as splint that OT will likely make    Exercises     Assessment/Plan    PT Assessment Patient does not need any further PT services  PT Problem List         PT Treatment Interventions      PT Goals (Current goals can be found in the Care Plan section)  Acute Rehab PT Goals Patient Stated Goal: get back to tennis PT Goal Formulation: All assessment and education complete, DC therapy    Frequency       Co-evaluation               AM-PAC PT "6 Clicks" Mobility  Outcome Measure Help needed turning from your back to your side while in a flat bed without using bedrails?: None Help needed moving from lying on your back to sitting on the side of a flat bed without using bedrails?: None Help needed moving to and from a bed to a chair (including a wheelchair)?: None Help needed standing up from a chair using your arms (e.g., wheelchair or bedside chair)?: None Help needed to walk in hospital room?: None Help needed climbing 3-5 steps with a railing? : None 6 Click Score: 24    End of Session   Activity Tolerance: Patient tolerated treatment well Patient left: in chair;with call bell/phone within reach Nurse Communication: Mobility status PT Visit Diagnosis: Unsteadiness on feet (R26.81)    Time: 4259-5638 PT Time Calculation (min) (ACUTE ONLY): 23 min   Charges:   PT Evaluation $PT Eval Low Complexity: 1 Low PT Treatments $Gait Training: 8-22 mins         Leighton Roach, PT  Acute Rehab Services  Pager [phone] Office [place] 06/02/2021, 5:20 PM

## 2021-06-02 NOTE — Progress Notes (Signed)
°  Echocardiogram 2D Echocardiogram has been performed.  Ryan Nixon 06/02/2021, 2:52 PM

## 2021-06-02 NOTE — TOC CAGE-AID Note (Signed)
Transition of Care [facility]) - CAGE-AID Screening   Patient Details  Name: Linnie Mcglocklin MRN: [id number] Date of Birth: 07/16/1960  Transition of Care [facility]) CM/SW Contact:    Osmin Welz C Tarpley-Carter, [place] Phone Number: 06/02/2021, 1:13 PM   Clinical Narrative: Pt is unable to participate in Cage Aid.  Cardelia Sassano Tarpley-Carter, MSW, LCSW-A Pronouns:  She/Her/Hers [place] Transitions of Care Clinical Social Worker Direct Number:  [phone] [email or web address]    CAGE-AID Screening: Substance Abuse Screening unable to be completed due to: : Patient unable to participate             Substance Abuse Education Offered: No

## 2021-06-03 MED ORDER — ATORVASTATIN CALCIUM 80 MG PO TABS
80.0000 mg | ORAL_TABLET | Freq: Every day | ORAL | 1 refills | Status: AC
Start: 2021-06-04 — End: ?

## 2021-06-03 NOTE — Discharge Summary (Addendum)
Stroke Discharge Summary  Patient ID: Ryan Nixon   MRN: [id number]      DOB: Nov 24, 1961  Date of Admission: 06/01/2021 Date of Discharge: 06/03/2021  Attending Physician:  Stroke, Md, MD, Stroke MD Consultant(s):    None  Patient's PCP:  Ryan Pepper, MD  DISCHARGE DIAGNOSIS: Right radial nerve palsy  Strokelike episode with right hand weakness treated with IV TNK Active Problems:   * No active hospital problems. *   Allergies as of 06/03/2021   No Known Allergies      Medication List     TAKE these medications    Advil PM 200-25 MG Caps Generic drug: Ibuprofen-diphenhydrAMINE HCl Take 2 capsules by mouth at bedtime.   atorvastatin 80 MG tablet Commonly known as: LIPITOR Take 1 tablet (80 mg total) by mouth daily. Start taking on: June 04, 2021 What changed:  medication strength how much to take   [employer] Back [employer] Body Pain 500-325 MG Pack Generic drug: Aspirin-Acetaminophen Take 1 Package by mouth daily as needed (pain).   multivitamin with minerals tablet Take 1 tablet by mouth daily.   naproxen 500 MG tablet Commonly known as: NAPROSYN Take 500 mg by mouth 2 (two) times daily as needed for mild pain.   omeprazole 20 MG capsule Commonly known as: PRILOSEC Take 20 mg by mouth daily as needed (acid reflux).   ondansetron 8 MG tablet Commonly known as: ZOFRAN Take 8 mg by mouth every 8 (eight) hours as needed for nausea.   SUMAtriptan 100 MG tablet Commonly known as: IMITREX Take 100 mg by mouth every 2 (two) hours as needed for migraine. May repeat in 2 hours if headache persists or recurs.        LABORATORY STUDIES CBC    Component Value Date/Time   WBC 5.6 06/01/2021 1918   RBC 3.62 (L) 06/01/2021 1918   HGB 13.9 06/01/2021 1935   HGB 12.3 (L) 04/09/2021 1304   HCT 41.0 06/01/2021 1935   HCT 40.6 04/02/2019 1251   PLT 193 06/01/2021 1918   PLT 164 04/09/2021 1304   MCV 104.1 (H) 06/01/2021 1918   MCH 35.9 (H) 06/01/2021 1918   MCHC  34.5 06/01/2021 1918   RDW 12.4 06/01/2021 1918   LYMPHSABS 2.2 06/01/2021 1918   MONOABS 0.3 06/01/2021 1918   EOSABS 0.0 06/01/2021 1918   BASOSABS 0.0 06/01/2021 1918   CMP    Component Value Date/Time   NA 142 06/01/2021 1935   K 3.6 06/01/2021 1935   CL 103 06/01/2021 1935   CO2 28 06/01/2021 1918   GLUCOSE 86 06/01/2021 1935   BUN 13 06/01/2021 1935   CREATININE 1.30 (H) 06/01/2021 1935   CREATININE 0.97 04/09/2021 1304   CALCIUM 9.1 06/01/2021 1918   PROT 7.7 06/01/2021 1918   ALBUMIN 4.3 06/01/2021 1918   AST 24 06/01/2021 1918   AST 29 04/09/2021 1304   ALT 21 06/01/2021 1918   ALT 16 04/09/2021 1304   ALKPHOS 47 06/01/2021 1918   BILITOT 0.9 06/01/2021 1918   BILITOT 0.8 04/09/2021 1304   GFRNONAA >60 06/01/2021 1918   GFRNONAA >60 04/09/2021 1304   GFRAA >60 12/12/2019 1012   COAGS Lab Results  Component Value Date   INR 0.9 06/01/2021   Lipid Panel    Component Value Date/Time   CHOL 195 06/02/2021 0415   TRIG 79 06/02/2021 0415   HDL 62 06/02/2021 0415   CHOLHDL 3.1 06/02/2021 0415   VLDL 16 06/02/2021 0415  LDLCALC 117 (H) 06/02/2021 0415   HgbA1C  Lab Results  Component Value Date   HGBA1C 5.2 06/02/2021   Urinalysis No results found for: COLORURINE, APPEARANCEUR, LABSPEC, PHURINE, GLUCOSEU, HGBUR, BILIRUBINUR, KETONESUR, PROTEINUR, UROBILINOGEN, NITRITE, LEUKOCYTESUR Urine Drug Screen No results found for: LABOPIA, COCAINSCRNUR, LABBENZ, AMPHETMU, THCU, LABBARB  Alcohol Level No results found for: ETH   SIGNIFICANT DIAGNOSTIC STUDIES MR ANGIO HEAD WO CONTRAST  Result Date: 06/02/2021 CLINICAL DATA:  History of headaches, presenting with sudden onset right hand weakness and numbness EXAM: MRI HEAD WITHOUT CONTRAST MRA HEAD WITHOUT CONTRAST TECHNIQUE: Multiplanar, multi-echo pulse sequences of the brain and surrounding structures were acquired without intravenous contrast. Angiographic images of the Circle of Willis were acquired using MRA  technique without intravenous contrast. COMPARISON:  There is no acute intracranial hemorrhage, extra-axial fluid collection, or acute infarct. Parenchymal volume is normal. The ventricles are normal in size. There is patchy FLAIR signal abnormality in the subcortical and periventricular white matter likely reflecting sequela of mild chronic white matter microangiopathy. There is no suspicious parenchymal signal abnormality. There is no mass lesion. There is no midline shift. FINDINGS: MRI HEAD FINDINGS Brain: No acute infarction, hemorrhage, hydrocephalus, extra-axial collection or mass lesion. Vascular: Normal flow voids. Skull and upper cervical spine: Normal marrow signal. Sinuses/Orbits: The paranasal sinuses are clear. The globes and orbits are unremarkable. Other: None. MRA HEAD FINDINGS Anterior circulation: The intracranial ICAs are patent. The bilateral MCAs are patent. The bilateral ACAs are patent. The anterior communicating artery is normal. There is no aneurysm or AVM. Posterior circulation: The bilateral V4 segments are patent. PICA is identified bilaterally. The basilar artery is patent. The bilateral PCAs are patent. There is no aneurysm or AVM. Anatomic variants: None. IMPRESSION: 1. No acute intracranial pathology. 2. Mild chronic white matter microangiopathy. 3. Normal intracranial MRA. Electronically Signed   By: Valetta Mole M.D.   On: 06/02/2021 11:01   MR BRAIN WO CONTRAST  Result Date: 06/02/2021 CLINICAL DATA:  History of headaches, presenting with sudden onset right hand weakness and numbness EXAM: MRI HEAD WITHOUT CONTRAST MRA HEAD WITHOUT CONTRAST TECHNIQUE: Multiplanar, multi-echo pulse sequences of the brain and surrounding structures were acquired without intravenous contrast. Angiographic images of the Circle of Willis were acquired using MRA technique without intravenous contrast. COMPARISON:  There is no acute intracranial hemorrhage, extra-axial fluid collection, or acute  infarct. Parenchymal volume is normal. The ventricles are normal in size. There is patchy FLAIR signal abnormality in the subcortical and periventricular white matter likely reflecting sequela of mild chronic white matter microangiopathy. There is no suspicious parenchymal signal abnormality. There is no mass lesion. There is no midline shift. FINDINGS: MRI HEAD FINDINGS Brain: No acute infarction, hemorrhage, hydrocephalus, extra-axial collection or mass lesion. Vascular: Normal flow voids. Skull and upper cervical spine: Normal marrow signal. Sinuses/Orbits: The paranasal sinuses are clear. The globes and orbits are unremarkable. Other: None. MRA HEAD FINDINGS Anterior circulation: The intracranial ICAs are patent. The bilateral MCAs are patent. The bilateral ACAs are patent. The anterior communicating artery is normal. There is no aneurysm or AVM. Posterior circulation: The bilateral V4 segments are patent. PICA is identified bilaterally. The basilar artery is patent. The bilateral PCAs are patent. There is no aneurysm or AVM. Anatomic variants: None. IMPRESSION: 1. No acute intracranial pathology. 2. Mild chronic white matter microangiopathy. 3. Normal intracranial MRA. Electronically Signed   By: Valetta Mole M.D.   On: 06/02/2021 11:01   ECHOCARDIOGRAM COMPLETE  Result Date: 06/02/2021  ECHOCARDIOGRAM REPORT   Patient Name:   ASENCION LOVEDAY Date of Exam: 06/02/2021 Medical Rec #:  [id number]    Height:       67.0 in Accession #:    [id number]   Weight:       125.0 lb Date of Birth:  1961-03-01   BSA:          1.656 m Patient Age:    61 years     BP:           114/74 mmHg Patient Gender: M            HR:           60 bpm. Exam Location:  Inpatient Procedure: 2D Echo, Cardiac Doppler and Color Doppler Indications:    Stroke  History:        Patient has prior history of Echocardiogram examinations, most                 recent 09/13/2012. Risk Factors:Hypertension and Dyslipidemia.  Sonographer:    Clayton Lefort  RDCS (AE) Referring Phys: [id number] [place]  1. Left ventricular ejection fraction, by estimation, is 60 to 65%. The left ventricle has normal function. The left ventricle has no regional wall motion abnormalities. Left ventricular diastolic parameters were normal.  2. Right ventricular systolic function is normal. The right ventricular size is normal.  3. The mitral valve is normal in structure. No evidence of mitral valve regurgitation. No evidence of mitral stenosis.  4. The aortic valve is tricuspid. Aortic valve regurgitation is not visualized. No aortic stenosis is present.  5. The inferior vena cava is normal in size with greater than 50% respiratory variability, suggesting right atrial pressure of 3 mmHg. Conclusion(s)/Recommendation(s): No intracardiac source of embolism detected on this transthoracic study. Consider a transesophageal echocardiogram to exclude cardiac source of embolism if clinically indicated. FINDINGS  Left Ventricle: Left ventricular ejection fraction, by estimation, is 60 to 65%. The left ventricle has normal function. The left ventricle has no regional wall motion abnormalities. The left ventricular internal cavity size was normal in size. There is  no left ventricular hypertrophy. Left ventricular diastolic parameters were normal. Right Ventricle: The right ventricular size is normal. No increase in right ventricular wall thickness. Right ventricular systolic function is normal. Left Atrium: Left atrial size was normal in size. Right Atrium: Right atrial size was normal in size. Pericardium: There is no evidence of pericardial effusion. Mitral Valve: The mitral valve is normal in structure. No evidence of mitral valve regurgitation. No evidence of mitral valve stenosis. Tricuspid Valve: The tricuspid valve is normal in structure. Tricuspid valve regurgitation is not demonstrated. No evidence of tricuspid stenosis. Aortic Valve: The aortic valve is tricuspid. Aortic  valve regurgitation is not visualized. No aortic stenosis is present. Aortic valve mean gradient measures 3.0 mmHg. Aortic valve peak gradient measures 5.4 mmHg. Aortic valve area, by VTI measures 3.15 cm. Pulmonic Valve: The pulmonic valve was normal in structure. Pulmonic valve regurgitation is not visualized. No evidence of pulmonic stenosis. Aorta: The aortic root is normal in size and structure. Venous: The inferior vena cava is normal in size with greater than 50% respiratory variability, suggesting right atrial pressure of 3 mmHg. IAS/Shunts: No atrial level shunt detected by color flow Doppler.  LEFT VENTRICLE PLAX 2D LVIDd:         4.40 cm   Diastology LVIDs:         2.80 cm   LV e' medial:  8.25 cm/s LV PW:         1.10 cm   LV E/e' medial:  7.4 LV IVS:        0.90 cm   LV e' lateral:   13.30 cm/s LVOT diam:     2.30 cm   LV E/e' lateral: 4.6 LV SV:         80 LV SV Index:   48 LVOT Area:     4.15 cm  RIGHT VENTRICLE             IVC RV Basal diam:  3.20 cm     IVC diam: 1.00 cm RV S prime:     12.20 cm/s TAPSE (M-mode): 2.1 cm LEFT ATRIUM           Index        RIGHT ATRIUM           Index LA diam:      2.60 cm 1.57 cm/m   RA Area:     16.80 cm LA Vol (A2C): 26.4 ml 15.94 ml/m  RA Volume:   50.60 ml  30.55 ml/m LA Vol (A4C): 54.9 ml 33.15 ml/m  AORTIC VALVE AV Area (Vmax):    3.38 cm AV Area (Vmean):   3.23 cm AV Area (VTI):     3.15 cm AV Vmax:           116.00 cm/s AV Vmean:          77.000 cm/s AV VTI:            0.253 m AV Peak Grad:      5.4 mmHg AV Mean Grad:      3.0 mmHg LVOT Vmax:         94.50 cm/s LVOT Vmean:        59.900 cm/s LVOT VTI:          0.192 m LVOT/AV VTI ratio: 0.76  AORTA Ao Root diam: 3.40 cm Ao Asc diam:  3.50 cm MITRAL VALVE MV Area (PHT): 2.44 cm    SHUNTS MV Decel Time: 311 msec    Systemic VTI:  0.19 m MV E velocity: 60.70 cm/s  Systemic Diam: 2.30 cm MV A velocity: 46.00 cm/s MV E/A ratio:  1.32 Candee Furbish MD Electronically signed by Candee Furbish MD Signature  Date/Time: 06/02/2021/3:02:20 PM    Final    CT HEAD CODE STROKE WO CONTRAST  Result Date: 06/01/2021 CLINICAL DATA:  Code stroke.  Numbness weakness in the right arm EXAM: CT HEAD WITHOUT CONTRAST TECHNIQUE: Contiguous axial images were obtained from the base of the skull through the vertex without intravenous contrast. RADIATION DOSE REDUCTION: This exam was performed according to the departmental dose-optimization program which includes automated exposure control, adjustment of the mA and/or kV according to patient size and/or use of iterative reconstruction technique. COMPARISON:  None. FINDINGS: Brain: No evidence of acute cortical infarction, hemorrhage, cerebral edema, mass, mass effect, or midline shift. Mild hypodensity in the posterior left left-greater-than-right corona radiata (series 3, image 20), favored to represent the sequela of chronic small vessel ischemic disease, although this is of indeterminate acuity. Ventricles and sulci are normal for age. No extra-axial fluid collection. Vascular: No hyperdense vessel or unexpected calcification. Skull: Normal. Negative for fracture or focal lesion. Sinuses/Orbits: No acute finding. Other: The mastoid air cells are well aerated. ASPECTS [facility] Stroke Program Early CT Score) - Ganglionic level infarction (caudate, lentiform nuclei, internal capsule, insula, M1-M3 cortex): 7 - Supraganglionic infarction (M4-M6 cortex):  3 Total score (0-10 with 10 being normal): 10 IMPRESSION: 1. No acute hemorrhage or large cortical infarction. 2. Hypodensity in the left-greater-than-right corona radiata, favored to be the result of chronic small vessel ischemic disease, although technically of indeterminate acuity. 3. ASPECTS is 10 Code stroke imaging results were communicated on 06/01/2021 at 7:29 pm to provider Dr. Lorrin Goodell via telephone, who verbally acknowledged these results. Electronically Signed   By: Merilyn Baba M.D.   On: 06/01/2021 19:32   VAS US  CAROTID  Result Date: 06/02/2021 Carotid Arterial Duplex Study Patient Name:  ELLISON RIETH  Date of Exam:   06/02/2021 Medical Rec #: [id number]     Accession #:    [id number] Date of Birth: 09-22-1960    Patient Gender: M Patient Age:   14 years Exam Location:  [facility] Procedure:      VAS US CAROTID Referring Phys: Alferd Patee [facility] --------------------------------------------------------------------------------  Indications:       CVA. Risk Factors:      Hypertension, hyperlipidemia. Comparison Study:  No prior studies. Performing Technologist: Darlin Coco RDMS, RVT  Examination Guidelines: A complete evaluation includes B-mode imaging, spectral Doppler, color Doppler, and power Doppler as needed of all accessible portions of each vessel. Bilateral testing is considered an integral part of a complete examination. Limited examinations for reoccurring indications may be performed as noted.  Right Carotid Findings: +----------+--------+--------+--------+------------------+------------------+             PSV cm/s EDV cm/s Stenosis Plaque Description Comments            +----------+--------+--------+--------+------------------+------------------+  CCA Prox   97       19                                                       +----------+--------+--------+--------+------------------+------------------+  CCA Distal 86       19                                   intimal thickening  +----------+--------+--------+--------+------------------+------------------+  ICA Prox   78       24                                   intimal thickening  +----------+--------+--------+--------+------------------+------------------+  ICA Distal 49       18                                                       +----------+--------+--------+--------+------------------+------------------+  ECA        97       14                                                        +----------+--------+--------+--------+------------------+------------------+ +----------+--------+-------+----------------+-------------------+             PSV cm/s EDV cms Describe  Arm Pressure (mmHG)  +----------+--------+-------+----------------+-------------------+  Subclavian 83               Multiphasic, WNL                      +----------+--------+-------+----------------+-------------------+ +---------+--------+--+--------+--+---------+  Vertebral PSV cm/s 61 EDV cm/s 26 Antegrade  +---------+--------+--+--------+--+---------+  Left Carotid Findings: +----------+--------+--------+--------+------------------+------------------+             PSV cm/s EDV cm/s Stenosis Plaque Description Comments            +----------+--------+--------+--------+------------------+------------------+  CCA Prox   118      25                                                       +----------+--------+--------+--------+------------------+------------------+  CCA Distal 93       20                                   intimal thickening  +----------+--------+--------+--------+------------------+------------------+  ICA Prox   66       23                                   intimal thickening  +----------+--------+--------+--------+------------------+------------------+  ICA Distal 64       23                                                       +----------+--------+--------+--------+------------------+------------------+  ECA        73       10                                                       +----------+--------+--------+--------+------------------+------------------+ +----------+--------+--------+----------------+-------------------+             PSV cm/s EDV cm/s Describe         Arm Pressure (mmHG)  +----------+--------+--------+----------------+-------------------+  Subclavian 95                Multiphasic, WNL                      +----------+--------+--------+----------------+-------------------+  +---------+--------+--+--------+--+---------+  Vertebral PSV cm/s 37 EDV cm/s 10 Antegrade  +---------+--------+--+--------+--+---------+   Summary: Right Carotid: The extracranial vessels were near-normal with only minimal wall                thickening or plaque. Left Carotid: The extracranial vessels were near-normal with only minimal wall               thickening or plaque. Vertebrals:  Bilateral vertebral arteries demonstrate antegrade flow. Subclavians: Normal flow hemodynamics were seen in bilateral subclavian              arteries. *See table(s) above for measurements and observations.     Preliminary       HISTORY OF PRESENT ILLNESS Patient  with a history of  headaches, HLD and HTN was working at his computer and experienced sudden onset right hand weakness and numbness.  HOSPITAL COURSE Ryan Nixon is a 61 y.o. male with PMH significant for  Headache on sumatriptan PRN, Hyperlipidemia, and Hypertension who presents with sudden onset right hand weakness and numbness.  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  He was working on his computer when he had sudden onset right hand weakness and numbness that started at 1800 on 06/01/2021.  He has never had anything this in the past.  Symptoms are persistent so he came to the emergency department with a stroke code was activated.  No prior history of ICH, does not smoke.  No significant alcohol intake.  No recent surgeries, no recent trauma, no history of MI, no history of aortic dissection.  He is not on any blood thinning medications.  LKW: 1800 on 06/01/2021. mRS: 0 tNKASE: Offered as symptom felt to be significantly disabling for patient despite low NIH stroke scale. He is right handed and as an Chartered certified accountant spends most of his working days on a computer. We discussed risks and benefits of tNKASE including risks of ICH that can also result in death and patient agreed to tNKASE. Decision to offer  tNKASE made at 1926, pharmacy notified at 1927. Thrombectomy: Not offered, symptosm too mild.     DISCHARGE EXAM Blood pressure (!) 120/100, pulse 61, temperature 97.8 F (36.6 C), temperature source Oral, resp. rate 20, height 5\' 7"  (1.702 m), weight 56.7 kg, SpO2 100 %.  General:  Alert, well-developed, well-nourished male in no acute distress   NEURO:  Mental Status: AA&Ox3  Speech/Language: speech is without dysarthria or aphasia.    Cranial Nerves:  II: PERRL.  III, IV, VI: EOMI. Eyelids elevate symmetrically.  Motor: 5/5 strength to LUE, LLE and RLE    Notable weakness in right hand when wrist is flexed but significantly improved strength when wrist is extended.  Gait- deferred   Discharge Diet       Diet   Diet Heart Room service appropriate? Yes with Assist; Fluid consistency: Thin   liquids  DISCHARGE PLAN Disposition:  to home No antithrombotic  Ongoing stroke risk factor control by Primary Care Physician at time of discharge Follow-up PCP Ryan Pepper, MD in 2 weeks. Follow-up in [facility] in 8 weeks, office to schedule an appointment.   Recommend outpatient EMG nerve conduction study to evaluate radial nerve palsy  35 minutes were spent preparing discharge.  Kermit , MSN, AGACNP-BC [facility] See Amion for schedule and pager information 06/03/2021 11:46 AM   I have personally obtained history,examined this patient, reviewed notes, independently viewed imaging studies, participated in medical decision making and plan of care.ROS completed by me personally and pertinent positives fully documented  I have made any additions or clarifications directly to the above note. Agree with note above.    Antony Contras, MD Medical Director [facility] Pager: [phone] 06/03/2021 3:02 PM

## 2021-06-03 NOTE — TOC Transition Note (Signed)
Transition of Care [facility]) - CM/SW Discharge Note   Patient Details  Name: Ryan Nixon MRN: [id number] Date of Birth: 05-Mar-1961  [facility] CM/SW Contact:  Ella Bodo, RN Phone Number: 06/03/2021, 12:24 PM   Clinical Narrative:    Pt is 61 yo male who presented with R UE numbness and weakness.CT showed hypodensity in the L>R corona radiata. Pt received TNK but symptoms continued. Pt diagnosed with radial nerve palsy.   PMH: HTN, HLD. PTA, pt very independent and living at home with spouse, who works from home.  OT recommending OP follow up of radial nerve palsy, and referral sent to [place] for follow up.   No other dc needs identified.                               Social Determinants of Health (SDOH) Interventions     Readmission Risk Interventions No flowsheet data found.  Reinaldo Raddle, RN, BSN  Trauma/Neuro ICU Case Manager [phone]

## 2021-06-03 NOTE — Evaluation (Signed)
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 No pertinent surgical history. HPI:  Pt is a 61 y/o male who presented with R UE numbness and weakness.CT showed hypodensity in the L>R corona radiata. Pt received TNK but symptoms persisted and pt diagnosed with radial nerve palsy. MRI brain negative. PMH: HTN, HLD.   Assessment / Plan / Recommendation Clinical Impression  Pt participated in speech/language evaluation. Pt reported that he has a bachelor's degree and is employed as an Chartered certified accountant for three companies. He denied any significant baseline difficulty in speech, language or cognition. However, he reported some difficulty with memory for which he compensates using external memory aids. [facility] Mental Status Examination was completed to evaluate the pt's cognitive-linguistic skills. His score was adjusted to exclude portions of the evaluation which required writing. He achieved an adjusted score of 15/24 and exhibited difficulty with memory. Further acute skilled SLP services are not clinically indicated at this time since pt is currently at baseline and reports adequate compensation. Pt was educated regarding results and recommendations; pt verbalized understanding as well as agreement with plan of care.    SLP Assessment  SLP Recommendation/Assessment: Patient does not need any further Speech [place] [facility] SLP Visit Diagnosis: Cognitive communication deficit (R41.841)    Recommendations  for follow up therapy are one component of a multi-disciplinary discharge planning process, led by the attending physician.  Recommendations may be updated based on patient status, additional functional criteria and insurance authorization.    Follow Up Recommendations  No SLP follow up    Assistance Recommended at Discharge  None  Functional Status Assessment Patient has not had a recent decline in their functional status  Frequency and Duration           SLP Evaluation Cognition  Overall Cognitive Status: History of cognitive impairments - at baseline Arousal/Alertness: Awake/alert Orientation Level: Oriented X4 Year: 2023 Month: February Day of Week: Correct Attention: Focused;Sustained Focused Attention: Appears intact Sustained Attention: Appears intact Memory: Impaired Memory Impairment: Decreased short term memory;Storage deficit;Decreased recall of new information (Immediate: 5/5 with repetition; delayed: 4/5; with cue: 1/1) Awareness: Appears intact Problem Solving: Appears intact (2/2)       Comprehension  Auditory Comprehension Overall Auditory Comprehension: Appears within functional limits for tasks assessed Yes/No Questions: Within Functional Limits Commands: Within Functional Limits Conversation: Complex    Expression Expression Primary Mode of Expression: Verbal Verbal Expression Overall Verbal Expression: Appears within functional limits for tasks assessed Initiation: No impairment Level of Generative/Spontaneous Verbalization: Conversation Repetition: No impairment Naming: No impairment Pragmatics: No impairment   Oral / Motor  Oral Motor/Sensory Function Overall Oral Motor/Sensory Function: Within functional limits Motor Speech Overall Motor Speech: Appears within functional limits for tasks assessed Respiration: Within functional limits Phonation: Normal Resonance: Within functional limits Articulation: Within functional  limitis Intelligibility: Intelligible Motor Planning: Witnin functional limits Motor Speech Errors: Not applicable           Raea Magallon I. Hardin Negus, [place], [place] Office number [phone] Pager [place] I  Hardin Negus 06/03/2021, 10:33 AM

## 2021-06-03 NOTE — Progress Notes (Signed)
Occupational Therapy Treatment Note  R radial nerve palsy splint fabricated. Pt to wear splint for 1-2 hour intervals as needed throughout the day for exercise and to increase function R hand. R styloid padded with gel pad due to prominence of ulnar styloid process. R wrist cock up splint ordered. Pt able to donn/doff splint independently. Will return to complete education on use of splints and HEP.   06/03/21 1519  OT Visit Information  Last OT Received On 06/03/21  Assistance Needed +1  History of Present Illness Pt is 61 yo male who presented with R UE numbness and weakness.CT showed hypodensity in the L>R corona radiata. Pt received TNK but symptoms continued. Pt diagnosed with radial nerve palsy.   PMH: HTN, HLD.  Precautions  Precautions None  Pain Assessment  Pain Assessment No/denies pain  General Comments  General comments (skin integrity, edema, etc.) R radial nerve palsy splint fabricated. Pt ableto use R hand to grasp/release light objects with use of splint. Pt educated on how to donn/doff splint. Pt to wear splint for 1-2 hour intervals/as needed during daytime hours; wrist cock-up splint ordered to wear at night and PRN during the day.  OT - End of Session  Activity Tolerance Patient tolerated treatment well  Patient left in chair;with call bell/phone within reach  Nurse Communication Other (comment) (DC needs)  OT Assessment/Plan  OT Plan Discharge plan remains appropriate  OT Visit Diagnosis Muscle weakness (generalized) (M62.81)  OT Frequency (ACUTE ONLY) Min 3X/week  Follow Up Recommendations Outpatient OT  Assistance recommended at discharge Set up Supervision/Assistance  Patient can return home with the following Assist for transportation  OT Equipment None recommended by OT  AM-PAC OT "6 Clicks" Daily Activity Outcome Measure (Version 2)  Help from another person eating meals? 4  Help from another person taking care of personal grooming? 4  Help from another  person toileting, which includes using toliet, bedpan, or urinal? 4  Help from another person bathing (including washing, rinsing, drying)? 4  Help from another person to put on and taking off regular upper body clothing? 4  Help from another person to put on and taking off regular lower body clothing? 4  6 Click Score 24  Progressive Mobility  What is the highest level of mobility based on the progressive mobility assessment? Level 6 (Walks independently in room and hall) - Balance while walking in room without assist - Complete  OT Goal Progression  Progress towards OT goals Progressing toward goals  Acute Rehab OT Goals  Patient Stated Goal to gegain use of R arm  OT Goal Formulation With patient  Time For Goal Achievement 06/17/21  Potential to Achieve Goals Good  OT Time Calculation  OT Start Time (ACUTE ONLY) 1120  OT Stop Time (ACUTE ONLY) 1300  OT Time Calculation (min) 100 min  OT General Charges  $OT Visit 1 Visit  OT Treatments  $Therapeutic Activity 8-22 mins  $Neuromuscular Re-education 8-22 mins  $Orthotics Fit/Training 68-82 mins   Maurie Boettcher, OT/L   Acute OT Clinical Specialist [place] Pager [phone] Office [phone]

## 2021-06-03 NOTE — Discharge Instructions (Signed)
Mr. Desilets, you were admitted with sudden onset weakness of the right hand.  This was initially though to be a stroke and was treated with TNK.  Upon further evaluation, you were diagnosed with radial nerve palsy.  This will be treated with a splint and occupational therapy interventions.  You will need to follow up with a neurologist soon after discharge for further evaluation of the muscles and nerves in your right hand and arm.

## 2021-06-03 NOTE — Progress Notes (Signed)
Orthopedic Tech Progress Note Patient Details:  Ryan Nixon [birth year] [id number]  Ortho Devices Type of Ortho Device: Velcro wrist splint Ortho Device/Splint Location: RUE Ortho Device/Splint Interventions: Ordered   Post Interventions Patient Tolerated: Well Instructions Provided: [facility] of [place] 06/03/2021, 2:06 PM

## 2021-06-03 NOTE — Progress Notes (Signed)
Occupational Therapy Treatment Note  Completed education regarding use of splints and HEP. Recommend follow up with Neuro outpt OT.    06/03/21 1519 06/03/21 1531  OT Visit Information  Last OT Received On  --  06/03/21  Assistance Needed  --  +1  History of Present Illness  --  Pt is 61 yo male who presented with R UE numbness and weakness.CT showed hypodensity in the L>R corona radiata. Pt received TNK but symptoms continued. Pt diagnosed with radial nerve palsy.   PMH: HTN, HLD.  Precautions  Precautions  --  None  Pain Assessment  Pain Assessment  --  No/denies pain  General Comments  General comments (skin integrity, edema, etc.)  --  Pt fit with pre-fab wrist cock up slint. Pt able to donn/doff independently. Little finger loop on radial nerve palsy splint repaired. Pt given additional strapping and supplies for splint if needed. Pt asking about use of splints for computer wrok. Pt will need to use the radial nerve palsy splint and a keyboard wrist support. Educate don weraing times for both splint and to keep away from keep adn to clean with an antibacterial wipe.  Exercises  Exercises  --  General Upper Extremity;Other exercises  General Exercises - Upper Extremity  Wrist Extension  --  PROM;Right;10 reps  Digit Composite Flexion  --  AROM;Right;10 reps  Composite Extension  --  PROM;Right;10 reps  Other Exercises  Other Exercises  --  Educated on HEP for R wrist and finger/thumb exercises - written HEP provided and reviewed. Pt verbalized understanding  OT - End of Session  Activity Tolerance  --  Patient tolerated treatment well  Patient left  --  in chair  Nurse Communication  --  Other (comment) (DC needs)  OT Assessment/Plan  OT Plan  --  Discharge plan remains appropriate  OT Visit Diagnosis  --  Muscle weakness (generalized) (M62.81)  OT Frequency (ACUTE ONLY)  --  Min 3X/week  Follow Up Recommendations  --  Outpatient OT  Assistance recommended at discharge  --  Set  up Supervision/Assistance  Patient can return home with the following  --  Assist for transportation  OT Equipment  --  None recommended by OT  AM-PAC OT "6 Clicks" Daily Activity Outcome Measure (Version 2)  Help from another person eating meals? 4 4  Help from another person taking care of personal grooming? 4 4  Help from another person toileting, which includes using toliet, bedpan, or urinal? 4 4  Help from another person bathing (including washing, rinsing, drying)? 4 4  Help from another person to put on and taking off regular upper body clothing? 4 4  Help from another person to put on and taking off regular lower body clothing? <[id number]>_1<\/VCBSWHQPRFFMBWGY>_6 Click Score 24 24  Progressive Mobility  What is the highest level of mobility based on the progressive mobility assessment?  --  Level 6 (Walks independently in room and hall) - Balance while walking in room without assist - Complete  OT Goal Progression  Progress towards OT goals  --  Goals met/education completed, patient discharged from OT  Acute Rehab OT Goals  Patient Stated Goal  --  to regain use R hand  OT Goal Formulation  --  With patient  Time For Goal Achievement  --  06/17/21  Potential to Achieve Goals  --  Good  OT Time Calculation  OT Start Time (ACUTE ONLY) 1120 1350  OT Stop Time (ACUTE ONLY)  1300 1430  OT Time Calculation (min) 100 min 40 min  OT General Charges  $OT Visit  --  1 Visit  OT Treatments  $Therapeutic Activity  --  23-37 mins  $Therapeutic Exercise  --  8-22 mins   Ryan Nixon, OT/L   Acute OT Clinical Specialist [place] Pager [phone] Office [phone]

## 2021-06-03 NOTE — Evaluation (Signed)
Occupational Therapy Evaluation Patient Details Name: Ryan Nixon MRN: [id number] DOB: 11-07-1960 Today's Date: 06/03/2021   History of Present Illness Pt is 61 yo male who presented with R UE numbness and weakness.CT showed hypodensity in the L>R corona radiata. Pt received TNK but symptoms continued. Pt diagnosed with radial nerve palsy.   PMH: HTN, HLD.   Clinical Impression   At baseline pt lives independently with his wife and works in Engineer, technical sales.  Pt with apparent R radial nerve palsy. Will return to fabricate splint. Pt will need to follow up with OT at the neuro outpt center after DC.      Recommendations for follow up therapy are one component of a multi-disciplinary discharge planning process, led by the attending physician.  Recommendations may be updated based on patient status, additional functional criteria and insurance authorization.   Follow Up Recommendations  Outpatient OT (neuro outpt OT)    Assistance Recommended at Discharge Set up Supervision/Assistance  Patient can return home with the following Assist for transportation    Functional Status Assessment  Patient has had a recent decline in their functional status and demonstrates the ability to make significant improvements in function in a reasonable and predictable amount of time.  Equipment Recommendations  None recommended by OT    Recommendations for Other Services       Precautions / Restrictions Precautions Precautions: None      Mobility Bed Mobility Overal bed mobility: Independent                  Transfers Overall transfer level: Independent                        Balance Overall balance assessment: Independent                                         ADL either performed or assessed with clinical judgement   ADL Overall ADL's : Modified independent                                       General ADL Comments: Difficulty completing some  tasks due to R hand weakness, however able to complete     Vision         Perception     Praxis      Pertinent Vitals/Pain       Hand Dominance Right   Extremity/Trunk Assessment Upper Extremity Assessment Upper Extremity Assessment: RUE deficits/detail RUE Deficits / Details: unable to actively extend wrist, digits or thumb; complains of numbness in radial n distribution; all other ROM WFL RUE Sensation: decreased light touch RUE Coordination: decreased fine motor       Cervical / Trunk Assessment Cervical / Trunk Assessment: Normal   Communication Communication Communication: No difficulties   Cognition Arousal/Alertness: Awake/alert Behavior During Therapy: WFL for tasks assessed/performed Overall Cognitive Status: History of cognitive impairments - at baseline                                 General Comments: STM deficits per pt report     General Comments       Exercises     Shoulder Instructions      Home Living Family/patient  expects to be discharged to:: Private residence Living Arrangements: Spouse/significant other Available Help at Discharge: Family;Available 24 hours/day Type of Home: House Home Access: Level entry     Home Layout: Two level Alternate Level Stairs-Number of Steps: flight Alternate Level Stairs-Rails: Right Bathroom Shower/Tub: Walk-in shower;Tub/shower unit   Bathroom Toilet: Standard     Home Equipment: None   Additional Comments: works from home in [place]. Wife works from home as well. Pt is a Firefighter  Lives With: Spouse    Prior Functioning/Environment Prior Level of Function : Independent/Modified Independent;Driving;Working/employed                        OT Problem List: Impaired UE functional use;Decreased coordination;Decreased range of motion;Decreased strength      OT Treatment/Interventions: Self-care/ADL training;Therapeutic exercise;Neuromuscular education;Therapeutic  activities;Splinting;Patient/family education    OT Goals(Current goals can be found in the care plan section) Acute Rehab OT Goals Patient Stated Goal: to regain use of his hand OT Goal Formulation: With patient Time For Goal Achievement: 06/17/21 Potential to Achieve Goals: Good  OT Frequency: Min 3X/week    Co-evaluation              AM-PAC OT "6 Clicks" Daily Activity     Outcome Measure Help from another person eating meals?: None Help from another person taking care of personal grooming?: None Help from another person toileting, which includes using toliet, bedpan, or urinal?: None Help from another person bathing (including washing, rinsing, drying)?: None Help from another person to put on and taking off regular upper body clothing?: None Help from another person to put on and taking off regular lower body clothing?: None 6 Click Score: 24   End of Session Nurse Communication: Other (comment) (splint needs)  Activity Tolerance: Patient tolerated treatment well Patient left: in chair  OT Visit Diagnosis: Muscle weakness (generalized) (M62.81)                Time: 4196-2229 OT Time Calculation (min): 20 min Charges:  OT General Charges $OT Visit: 1 Visit OT Evaluation $OT Eval Moderate Complexity: 1 Mod  Rhianon Zabawa, OT/L   Acute OT Clinical Specialist Acute Rehabilitation Services Pager [phone] Office [phone]   [facility] 06/03/2021, 3:17 PM

## 2021-06-09 ENCOUNTER — Encounter: Payer: Self-pay | Admitting: Occupational Therapy

## 2021-06-09 ENCOUNTER — Ambulatory Visit: Payer: BC Managed Care – PPO | Attending: Nurse Practitioner | Admitting: Occupational Therapy

## 2021-06-09 ENCOUNTER — Other Ambulatory Visit: Payer: Self-pay

## 2021-06-09 DIAGNOSIS — R29818 Other symptoms and signs involving the nervous system: Secondary | ICD-10-CM | POA: Insufficient documentation

## 2021-06-09 DIAGNOSIS — R208 Other disturbances of skin sensation: Secondary | ICD-10-CM | POA: Insufficient documentation

## 2021-06-09 DIAGNOSIS — R278 Other lack of coordination: Secondary | ICD-10-CM | POA: Insufficient documentation

## 2021-06-09 DIAGNOSIS — M6281 Muscle weakness (generalized): Secondary | ICD-10-CM | POA: Insufficient documentation

## 2021-06-09 NOTE — Therapy (Signed)
[facility] [address] [place], [place], [address] Phone: [phone]   Fax:  [phone]  Occupational Therapy Evaluation  Patient Details  Name: Ryan Nixon MRN: [id number] Date of Birth: 25-Oct-1960 Referring Provider (OT): Cortney de Yolanda Manges, NP   Encounter Date: 06/09/2021   OT End of Session - 06/09/21 1500     Visit Number 1    Number of Visits 17    Date for OT Re-Evaluation 08/18/21    Authorization Type BCBS, Aetna    Authorization Time Period VL: combined 60 PT/OT/Chiro (BCBS), VL: 120 (Aetna)    OT Start Time 1100    OT Stop Time 1145    OT Time Calculation (min) 45 min    Activity Tolerance Patient tolerated treatment well    Behavior During Therapy WFL for tasks assessed/performed             Past Medical History:  Diagnosis Date   Headache    Hyperlipidemia    Hypertension     History reviewed. No pertinent surgical history.  There were no vitals filed for this visit.   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 CT showed hypodensity in the L>R corona radiata. After received TNK symptoms continued and eventually diagnosed with Radial Nerve Palsy. Pt lives with spouse and does not report any difficulty with accessibility. Pt enjoys playing tennis and is not able to do so at this time. Pt works in Librarian, academic and reports some difficulty with work related tasks at this time d/t recent radial nerve palsy.    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 No/denies    Pain Score 0-No pain               [facility] OT Assessment - 06/09/21 1102       Assessment   Medical Diagnosis R Radial Nerve Palsy    Referring Provider (OT) [place], NP    Onset Date/Surgical Date 06/01/21    Hand Dominance Right    Prior Therapy Acute      Precautions   Precautions None    Required Braces or Orthoses Other Brace/Splint    Other Brace/Splint prefab wrist brace @ PM and throughout day, radial nerve splint fabricated in hospital      [place] expects to be discharged to: Private residence    Living Arrangements Spouse/significant other    Available Help at Discharge Family    Type of [place]      Prior Function   Level of Independence Independent    Vocation Full time employment    Hotel manager    Leisure play tennis      ADL   Eating/Feeding Modified independent    Grooming Modified independent    Upper Body Bathing Modified independent    Lower Body Bathing Modified independent    Upper Body Dressing Independent    Lower Body Dressing Modified independent    Toilet Transfer Modified independent    Toileting - Clothing Manipulation Modified independent    Toileting -  Hygiene Modified Independent    Tub/Shower Transfer Modified independent  IADL   Shopping Shops independently for Corporate investment banker Does personal laundry completely    Meal Prep Plans, prepares and serves adequate meals independently    Programmer, applications own vehicle    Medication Management Is responsible for taking medication in correct dosages at correct time    Physiological scientist financial matters independently (budgets, writes checks, pays rent, bills goes to bank), collects and keeps track of income      Written Expression   Dominant Hand Right      Cognition   Overall Cognitive Status Within Functional Limits for tasks assessed      Observation/Other Assessments   Focus on Therapeutic Outcomes (FOTO)  N/A      Sensation   Light Touch Appears Intact    Hot/Cold Impaired Detail    Hot/Cold Impaired Details Impaired RUE     Proprioception Appears Intact      Coordination   9 Hole Peg Test Right;Left    Right 9 Hole Peg Test 86.44s    Left 9 Hole Peg Test 22.34s    Box and Blocks R 23 L 52      ROM / Strength   AROM / PROM / Strength AROM;Strength      AROM   Overall AROM  Deficits    Overall AROM Comments absent/trace wrist extension and digit extension    AROM Assessment Site Wrist    Right/Left Wrist Right      Strength   Overall Strength Deficits    Overall Strength Comments RUE radial nerve innervation      Hand Function   Right Hand Gross Grasp Impaired    Right Hand Grip (lbs) 24    Left Hand Gross Grasp Functional    Left Hand Grip (lbs) 61.2                                OT Short Term Goals - 06/09/21 1515       OT SHORT TERM GOAL #1   Title Pt will be independent with HEP    Time 4    Period Weeks    Status New    Target Date 07/07/21      OT SHORT TERM GOAL #2   Title Pt will be independent with wear and care of any splints and/or orthoses PRN    Baseline currently with 2 braces from hospital, continue to assess    Time 4    Period Weeks    Status New      OT SHORT TERM GOAL #3   Title Pt will increase grip strength in RUE by 5 lbs or greater    Baseline R 24, L 61.2    Time 4    Period Weeks    Status New      OT SHORT TERM GOAL #4   Title Pt will increase Box and Blocks score with RUE by at least 3 blocks    Baseline R 23 L 52    Time 4    Period Weeks    Status New               OT Long Term Goals - 06/09/21 1516       OT LONG TERM GOAL #1   Title Pt will be independent with updated HEP    Time 10    Period Weeks    Status New  Target Date 08/18/21      OT LONG TERM GOAL #2   Title Pt will increase grip strength in RUE by 10 lbs or greater    Baseline R 24, L 61.2    Time 10    Period Weeks    Status New      OT LONG TERM GOAL #3   Title Pt will complete 9 hole peg test with RUE in 75 seconds or less.    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 PMH significant for HTN and HLD. Pt presents with RUE deficits with strength, range of motion, lack of coordination and sensation d/t radial nerve palsy. These deficits are impeding overall independence with ADLs and IADLs. Skilled occupational therapy is recommended to target listed areas of deficit and increase independence with ADLs and IADLs.    OT Occupational Profile and History Problem Focused Assessment - Including review of records relating to presenting problem    Occupational performance deficits (Please refer to evaluation for details): ADL's;IADL's;Leisure;Work    Marketing executive / Function / Physical Skills ADL;Decreased knowledge of use of DME;Strength;Tone;GMC;Dexterity;Proprioception;UE functional use;ROM;IADL;Coordination;FMC    Rehab Potential Good    Clinical Decision Making Limited treatment options, no task modification necessary    Comorbidities Affecting Occupational Performance: None    Modification or Assistance to Complete Evaluation  No modification of tasks or assist necessary to complete eval    OT Frequency 2x / week    OT Duration Other (comment)   16 visits over 10 weeks d/t any scheduling conflicts   OT Treatment/Interventions Self-care/ADL training;Electrical Stimulation;Paraffin;Patient/family education;Manual  Therapy;Neuromuscular education;Passive range of motion;Therapeutic exercise;Ultrasound;Contrast Bath;Therapeutic activities;Splinting;DME and/or AE instruction;Fluidtherapy    Plan address any additional splinting needs, radial nerve glides as able, STM, ultrasound?    Consulted and Agree with Plan of Care Patient             Patient will benefit from skilled therapeutic intervention in order to improve the following deficits and impairments:   Body Structure / Function / Physical Skills: ADL, Decreased knowledge of use of DME, Strength, Tone, GMC, Dexterity, Proprioception, UE functional use, ROM, IADL, Coordination, [place]       Visit Diagnosis: Other symptoms and signs involving the nervous system - Plan: Ot plan of care cert/re-cert  Other lack of coordination - Plan: Ot plan of care cert/re-cert  Muscle weakness (generalized) - Plan: Ot plan of care cert/re-cert  Other disturbances of skin sensation - Plan: Ot plan of care cert/re-cert    Problem List Patient Active Problem List   Diagnosis Date  Noted   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

## 2021-06-11 ENCOUNTER — Other Ambulatory Visit: Payer: Self-pay

## 2021-06-11 ENCOUNTER — Ambulatory Visit: Payer: BC Managed Care – PPO | Admitting: Occupational Therapy

## 2021-06-11 ENCOUNTER — Encounter: Payer: Self-pay | Admitting: Occupational Therapy

## 2021-06-11 DIAGNOSIS — R208 Other disturbances of skin sensation: Secondary | ICD-10-CM

## 2021-06-11 DIAGNOSIS — R29818 Other symptoms and signs involving the nervous system: Secondary | ICD-10-CM

## 2021-06-11 DIAGNOSIS — R278 Other lack of coordination: Secondary | ICD-10-CM | POA: Diagnosis not present

## 2021-06-11 DIAGNOSIS — M6281 Muscle weakness (generalized): Secondary | ICD-10-CM

## 2021-06-11 NOTE — Therapy (Signed)
[facility] [address] [place], [place], [address] Phone: [phone]   Fax:  [phone]  Occupational Therapy Treatment  Patient Details  Name: Ryan Nixon MRN: [id number] Date of Birth: 1961/02/22 Referring Provider (OT): Cortney de Yolanda Manges, NP   Encounter Date: 06/11/2021   OT End of Session - 06/11/21 6283     Visit Number 2    Number of Visits 17    Date for OT Re-Evaluation 08/18/21    Authorization Type BCBS, Aetna    Authorization Time Period VL: combined 60 PT/OT/Chiro (BCBS), VL: 120 (Aetna)    OT Start Time 0935    OT Stop Time 1015    OT Time Calculation (min) 40 min    Activity Tolerance Patient tolerated treatment well    Behavior During Therapy [facility] for tasks assessed/performed             Past Medical History:  Diagnosis Date   Headache    Hyperlipidemia    Hypertension     History reviewed. No pertinent surgical history.  There were no vitals filed for this visit.   Subjective Assessment - 06/11/21 0936     Subjective  "The time threw me off - little bit of pain in my wrist"    Pertinent History HTN, HLD    Patient Stated Goals "I wanna be back on the courts by April" (tennis)    Currently in Pain? Yes    Pain Score 1     Pain Location Wrist    Pain Orientation Right    Pain Descriptors / Indicators Sore    Pain Type Acute pain    Pain Onset Yesterday    Pain Frequency Intermittent                Radial Nerve Glides - instructed in  Soft Tissue Massage - instructed in   Retrograde Massage - instructed in   Reviewed activity modifications                 OT Education - 06/11/21 1141     Education Details Radial Nerve Glides, STM, Activity Modifications, Retrograde Massage    Person(s) Educated Patient    Methods Explanation;Demonstration;Handout    Comprehension Verbalized understanding;Returned demonstration              OT Short Term  Goals - 06/11/21 0940       OT SHORT TERM GOAL #1   Title Pt will be independent with HEP    Time 4    Period Weeks    Status New    Target Date 07/07/21      OT SHORT TERM GOAL #2   Title Pt will be independent with wear and care of any splints and/or orthoses PRN    Baseline currently with 2 braces from hospital, continue to assess    Time 4    Period Weeks    Status New      OT SHORT TERM GOAL #3   Title Pt will increase grip strength in RUE by 5 lbs or greater    Baseline R 24, L 61.2    Time 4    Period Weeks    Status New      OT SHORT TERM GOAL #4   Title Pt will increase Box and Blocks score with RUE by at least 3 blocks    Baseline R 23 L 52    Time 4    Period Weeks  Status New               OT Long Term Goals - 06/09/21 1516       OT LONG TERM GOAL #1   Title Pt will be independent with updated HEP    Time 10    Period Weeks    Status New    Target Date 08/18/21      OT LONG TERM GOAL #2   Title Pt will increase grip strength in RUE by 10 lbs or greater    Baseline R 24, L 61.2    Time 10    Period Weeks    Status New      OT LONG TERM GOAL #3   Title Pt will complete 9 hole peg test with RUE in 75 seconds or less.    Baseline R 86.44s, L 22.34s    Time 10    Period Weeks    Status New      OT LONG TERM GOAL #4   Title Pt will demonstrate ability to hold tennis racket in RUE    Time 10    Period Weeks    Status New      OT LONG TERM GOAL #5   Title Pt will verbalize understanding of adapted strategies for increasing independence and safety with ADLs and IADLs (typing, handwriting, buttons, etc)    Time 10    Period Weeks    Status New                   Plan - 06/11/21 4431     Clinical Impression Statement Pt verbalized understanding of goals.    OT Occupational Profile and History Problem Focused Assessment - Including review of records relating to presenting problem    Occupational performance deficits (Please  refer to evaluation for details): ADL's;IADL's;Leisure;Work    Marketing executive / Function / Physical Skills ADL;Decreased knowledge of use of DME;Strength;Tone;GMC;Dexterity;Proprioception;UE functional use;ROM;IADL;Coordination;FMC    Rehab Potential Good    Clinical Decision Making Limited treatment options, no task modification necessary    Comorbidities Affecting Occupational Performance: None    Modification or Assistance to Complete Evaluation  No modification of tasks or assist necessary to complete eval    OT Frequency 2x / week    OT Duration Other (comment)   16 visits over 10 weeks d/t any scheduling conflicts   OT Treatment/Interventions Self-care/ADL training;Electrical Stimulation;Paraffin;Patient/family education;Manual Therapy;Neuromuscular education;Passive range of motion;Therapeutic exercise;Ultrasound;Contrast Bath;Therapeutic activities;Splinting;DME and/or AE instruction;Fluidtherapy    Plan address any additional splinting needs, radial nerve glides review, heat/hot pack to radial tunnel    Consulted and Agree with Plan of Care Patient             Patient will benefit from skilled therapeutic intervention in order to improve the following deficits and impairments:   Body Structure / Function / Physical Skills: ADL, Decreased knowledge of use of DME, Strength, Tone, GMC, Dexterity, Proprioception, UE functional use, ROM, IADL, Coordination, [facility]       Visit Diagnosis: Other symptoms and signs involving the nervous system  Other lack of coordination  Muscle weakness (generalized)  Other disturbances of skin sensation    Problem List Patient Active Problem List   Diagnosis Date Noted   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 [place], [place], [address] Phone: [phone]   Fax:  [phone]  Name: Ryan Nixon MRN:  [id number] Date of Birth: 1960-05-05

## 2021-06-12 DIAGNOSIS — G5630 Lesion of radial nerve, unspecified upper limb: Secondary | ICD-10-CM | POA: Diagnosis not present

## 2021-06-12 DIAGNOSIS — Z09 Encounter for follow-up examination after completed treatment for conditions other than malignant neoplasm: Secondary | ICD-10-CM | POA: Diagnosis not present

## 2021-06-12 DIAGNOSIS — E785 Hyperlipidemia, unspecified: Secondary | ICD-10-CM | POA: Diagnosis not present

## 2021-06-15 ENCOUNTER — Other Ambulatory Visit: Payer: Self-pay

## 2021-06-15 ENCOUNTER — Encounter: Payer: Self-pay | Admitting: Occupational Therapy

## 2021-06-15 ENCOUNTER — Ambulatory Visit: Payer: BC Managed Care – PPO | Admitting: Occupational Therapy

## 2021-06-15 DIAGNOSIS — R208 Other disturbances of skin sensation: Secondary | ICD-10-CM

## 2021-06-15 DIAGNOSIS — R278 Other lack of coordination: Secondary | ICD-10-CM

## 2021-06-15 DIAGNOSIS — R29818 Other symptoms and signs involving the nervous system: Secondary | ICD-10-CM | POA: Diagnosis not present

## 2021-06-15 DIAGNOSIS — M6281 Muscle weakness (generalized): Secondary | ICD-10-CM | POA: Diagnosis not present

## 2021-06-15 NOTE — Therapy (Signed)
[facility] [address] [place], [place], [address] Phone: [phone]   Fax:  [phone]  Occupational Therapy Treatment  Patient Details  Name: Ryan Nixon MRN: [id number] Date of Birth: 06-19-1960 Referring Provider (OT): Cortney de Yolanda Manges, NP   Encounter Date: 06/15/2021   OT End of Session - 06/15/21 1108     Visit Number 3    Number of Visits 17    Date for OT Re-Evaluation 08/18/21    Authorization Type BCBS, Aetna    Authorization Time Period VL: combined 60 PT/OT/Chiro (BCBS), VL: 120 (Aetna)    OT Start Time 1106    OT Stop Time 1145    OT Time Calculation (min) 39 min    Activity Tolerance Patient tolerated treatment well    Behavior During Therapy WFL for tasks assessed/performed             Past Medical History:  Diagnosis Date   Headache    Hyperlipidemia    Hypertension     History reviewed. No pertinent surgical history.  There were no vitals filed for this visit.   Subjective Assessment - 06/15/21 1107     Subjective  "it was eventful"    Pertinent History HTN, HLD    Patient Stated Goals "I wanna be back on the courts by April" (tennis)    Currently in Pain? No/denies    Pain Score 0-No pain                          OT Treatments/Exercises (OP) - 06/15/21 0001       Exercises   Exercises Wrist      Wrist Exercises   Other wrist exercises A/AROM working on activation of wrist extension and MP extension - trace/twitch activation with these movements today. Unable to hold against gravity      Modalities   Modalities Moist Heat   during education and review of retrograde massage, edema management and STM to elbow     Moist Heat Therapy   Number Minutes Moist Heat 10 Minutes    Moist Heat Location Elbow      Manual Therapy   Manual Therapy Soft tissue mobilization;Passive ROM;Myofascial release    Manual therapy comments --    Soft tissue  mobilization reviewed self STM to RUE radial tunnel    Myofascial Release with myofascial balls - manual therapy and self-myofascial massage    Passive ROM self PROM wrist                    OT Education - 06/15/21 1121     Education Details wrist PROM    Person(s) Educated Patient    Methods Explanation;Demonstration;Handout    Comprehension Verbalized understanding;Returned demonstration              OT Short Term Goals - 06/15/21 1112       OT SHORT TERM GOAL #1   Title Pt will be independent with HEP    Time 4    Period Weeks    Status On-going    Target Date 07/07/21      OT SHORT TERM GOAL #2   Title Pt will be independent with wear and care of any splints and/or orthoses PRN    Baseline currently with 2 braces from hospital, continue to assess    Time 4    Period Weeks    Status New  OT SHORT TERM GOAL #3   Title Pt will increase grip strength in RUE by 5 lbs or greater    Baseline R 24, L 61.2    Time 4    Period Weeks    Status New      OT SHORT TERM GOAL #4   Title Pt will increase Box and Blocks score with RUE by at least 3 blocks    Baseline R 23 L 52    Time 4    Period Weeks    Status New               OT Long Term Goals - 06/09/21 1516       OT LONG TERM GOAL #1   Title Pt will be independent with updated HEP    Time 10    Period Weeks    Status New    Target Date 08/18/21      OT LONG TERM GOAL #2   Title Pt will increase grip strength in RUE by 10 lbs or greater    Baseline R 24, L 61.2    Time 10    Period Weeks    Status New      OT LONG TERM GOAL #3   Title Pt will complete 9 hole peg test with RUE in 75 seconds or less.    Baseline R 86.44s, L 22.34s    Time 10    Period Weeks    Status New      OT LONG TERM GOAL #4   Title Pt will demonstrate ability to hold tennis racket in RUE    Time 10    Period Weeks    Status New      OT LONG TERM GOAL #5   Title Pt will verbalize understanding of adapted  strategies for increasing independence and safety with ADLs and IADLs (typing, handwriting, buttons, etc)    Time 10    Period Weeks    Status New                   Plan - 06/15/21 1123     Clinical Impression Statement Pt continues to benefit from skilled OT to work towards unmet goals. Pt with minimal progress with increased active range of motion from radial nerve palsy.    OT Occupational Profile and History Problem Focused Assessment - Including review of records relating to presenting problem    Occupational performance deficits (Please refer to evaluation for details): ADL's;IADL's;Leisure;Work    Marketing executive / Function / Physical Skills ADL;Decreased knowledge of use of DME;Strength;Tone;GMC;Dexterity;Proprioception;UE functional use;ROM;IADL;Coordination;FMC    Rehab Potential Good    Clinical Decision Making Limited treatment options, no task modification necessary    Comorbidities Affecting Occupational Performance: None    Modification or Assistance to Complete Evaluation  No modification of tasks or assist necessary to complete eval    OT Frequency 2x / week    OT Duration Other (comment)   16 visits over 10 weeks d/t any scheduling conflicts   OT Treatment/Interventions Self-care/ADL training;Electrical Stimulation;Paraffin;Patient/family education;Manual Therapy;Neuromuscular education;Passive range of motion;Therapeutic exercise;Ultrasound;Contrast Bath;Therapeutic activities;Splinting;DME and/or AE instruction;Fluidtherapy    Plan address any additional splinting needs, radial nerve glides review, heat/hot pack to radial tunnel    Consulted and Agree with Plan of Care Patient             Patient will benefit from skilled therapeutic intervention in order to improve the following deficits and impairments:   Body Structure /  Function / Physical Skills: ADL, Decreased knowledge of use of DME, Strength, Tone, GMC, Dexterity, Proprioception, UE functional use,  ROM, IADL, Coordination, [facility]       Visit Diagnosis: Other symptoms and signs involving the nervous system  Muscle weakness (generalized)  Other lack of coordination  Other disturbances of skin sensation    Problem List Patient Active Problem List   Diagnosis Date Noted   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 [place], [place], [address] Phone: [phone]   Fax:  [phone]  Name: Taelon Bendorf MRN: [id number] Date of Birth: 1960/06/12

## 2021-06-15 NOTE — Patient Instructions (Signed)
PROM: Wrist Flexion / Extension   Grasp  hand and slowly bend wrist until stretch is felt. Relax. Then stretch as far as possible in opposite direction. Be sure to keep elbow bent.  Hold __10__ sec. each way Repeat _5___ times per set.    Do _4-6___ sessions per day.  Pronation (Passive)   Keep elbow bent at right angle and held firmly to side. Use other hand to turn forearm until palm faces downward. Hold _10___ seconds. Repeat __5__ times. Do _4-6___ sessions per day.  Supination (Passive)   Keep elbow bent at right angle and held firmly at side. Use other hand to turn forearm until palm faces upward. Hold __10__ seconds. Repeat __5__ times. Do _4-6___ sessions per day.  Copyright  VHI. All rights reserved.

## 2021-06-17 ENCOUNTER — Ambulatory Visit: Payer: BC Managed Care – PPO | Admitting: Occupational Therapy

## 2021-06-17 ENCOUNTER — Other Ambulatory Visit: Payer: Self-pay

## 2021-06-17 ENCOUNTER — Encounter: Payer: Self-pay | Admitting: Occupational Therapy

## 2021-06-17 DIAGNOSIS — R208 Other disturbances of skin sensation: Secondary | ICD-10-CM | POA: Diagnosis not present

## 2021-06-17 DIAGNOSIS — M6281 Muscle weakness (generalized): Secondary | ICD-10-CM | POA: Diagnosis not present

## 2021-06-17 DIAGNOSIS — R278 Other lack of coordination: Secondary | ICD-10-CM | POA: Diagnosis not present

## 2021-06-17 DIAGNOSIS — R29818 Other symptoms and signs involving the nervous system: Secondary | ICD-10-CM

## 2021-06-17 NOTE — Patient Instructions (Signed)
°  1.  Support forearm on table with thumb facing up.  Hold small empty bottle (about 2inch diameter), in left hand. Slowly bring wrist back away from your body.  Repeat 15 times. 2x/day.   2.  AROM: Wrist Extension   .  With left palm down, bend wrist up. While holding small bottle in hand. Repeat __15__ times per set.  Do __2__ sessions per day.            3.  Extension      Prop up wrist on towel roll, with palm down and cloth under fingertips. Bend fingers then straighten fingers to push cloth out. Repeat __15__ times per set.  Do __2__ sessions per day.      4.  MP Extension (Assistive)    Place hand flat on table. Lift hand up, keeping fingers straight. Hold 5 seconds. Repeat 10times. Do 2 sessions per day.     5.  Flip cards over with left hand.

## 2021-06-17 NOTE — Therapy (Signed)
[facility] [address] [place], [place], [address] Phone: [phone]   Fax:  [phone]  Occupational Therapy Treatment  Patient Details  Name: Ryan Nixon MRN: [id number] Date of Birth: 09/28/1960 Referring Provider (OT): Cortney de Yolanda Manges, NP   Encounter Date: 06/17/2021   OT End of Session - 06/17/21 1152     Visit Number 4    Number of Visits 17    Date for OT Re-Evaluation 08/18/21    Authorization Type BCBS, Aetna    Authorization Time Period VL: combined 60 PT/OT/Chiro (BCBS), VL: 120 (Aetna)    OT Start Time 1150    OT Stop Time 1230    OT Time Calculation (min) 40 min    Activity Tolerance Patient tolerated treatment well    Behavior During Therapy WFL for tasks assessed/performed             Past Medical History:  Diagnosis Date   Headache    Hyperlipidemia    Hypertension     History reviewed. No pertinent surgical history.  There were no vitals filed for this visit.   Subjective Assessment - 06/17/21 1152     Subjective  Pt denies any changes or pain.    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 No/denies    Pain Score 0-No pain                          OT Treatments/Exercises (OP) - 06/17/21 0001       Wrist Exercises   Other wrist exercises A/AROM working on activation of wrist extension and MP extension - trace/twitch activation with these movements today. Unable to hold against gravity      Modalities   Modalities Electrical engineer Stimulation Location RUE wrist extension    Electrical Stimulation Parameters Intensity 15, 10 minutes, Trigger NMES small muscle    Electrical Stimulation Goals Strength;Neuromuscular facilitation                    OT Education - 06/17/21 1253     Education Details Radial Nerve HEP     Person(s) Educated Patient    Methods Explanation;Demonstration;Handout    Comprehension Verbalized understanding;Returned demonstration              OT Short Term Goals - 06/15/21 1112       OT SHORT TERM GOAL #1   Title Pt will be independent with HEP    Time 4    Period Weeks    Status On-going    Target Date 07/07/21      OT SHORT TERM GOAL #2   Title Pt will be independent with wear and care of any splints and/or orthoses PRN    Baseline currently with 2 braces from hospital, continue to assess    Time 4    Period Weeks    Status New      OT SHORT TERM GOAL #3   Title Pt will increase grip strength in RUE by 5 lbs or greater    Baseline R 24, L 61.2    Time 4    Period Weeks    Status New      OT SHORT TERM GOAL #4   Title Pt will increase Box and Blocks score with  RUE by at least 3 blocks    Baseline R 23 L 52    Time 4    Period Weeks    Status New               OT Long Term Goals - 06/09/21 1516       OT LONG TERM GOAL #1   Title Pt will be independent with updated HEP    Time 10    Period Weeks    Status New    Target Date 08/18/21      OT LONG TERM GOAL #2   Title Pt will increase grip strength in RUE by 10 lbs or greater    Baseline R 24, L 61.2    Time 10    Period Weeks    Status New      OT LONG TERM GOAL #3   Title Pt will complete 9 hole peg test with RUE in 75 seconds or less.    Baseline R 86.44s, L 22.34s    Time 10    Period Weeks    Status New      OT LONG TERM GOAL #4   Title Pt will demonstrate ability to hold tennis racket in RUE    Time 10    Period Weeks    Status New      OT LONG TERM GOAL #5   Title Pt will verbalize understanding of adapted strategies for increasing independence and safety with ADLs and IADLs (typing, handwriting, buttons, etc)    Time 10    Period Weeks    Status New                   Plan - 06/17/21 1253     Clinical Impression Statement Pt continues to progress with  RUE.    OT Occupational Profile and History Problem Focused Assessment - Including review of records relating to presenting problem    Occupational performance deficits (Please refer to evaluation for details): ADL's;IADL's;Leisure;Work    Marketing executive / Function / Physical Skills ADL;Decreased knowledge of use of DME;Strength;Tone;GMC;Dexterity;Proprioception;UE functional use;ROM;IADL;Coordination;FMC    Rehab Potential Good    Clinical Decision Making Limited treatment options, no task modification necessary    Comorbidities Affecting Occupational Performance: None    Modification or Assistance to Complete Evaluation  No modification of tasks or assist necessary to complete eval    OT Frequency 2x / week    OT Duration Other (comment)   16 visits over 10 weeks d/t any scheduling conflicts   OT Treatment/Interventions Self-care/ADL training;Electrical Stimulation;Paraffin;Patient/family education;Manual Therapy;Neuromuscular education;Passive range of motion;Therapeutic exercise;Ultrasound;Contrast Bath;Therapeutic activities;Splinting;DME and/or AE instruction;Fluidtherapy    Plan address any additional splinting needs, radial nerve glides review    Consulted and Agree with Plan of Care Patient             Patient will benefit from skilled therapeutic intervention in order to improve the following deficits and impairments:   Body Structure / Function / Physical Skills: ADL, Decreased knowledge of use of DME, Strength, Tone, GMC, Dexterity, Proprioception, UE functional use, ROM, IADL, Coordination, [facility]       Visit Diagnosis: Other symptoms and signs involving the nervous system  Muscle weakness (generalized)  Other lack of coordination  Other disturbances of skin sensation    Problem List Patient Active Problem List   Diagnosis Date Noted   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 [place], [place], [address] Phone: [phone]   Fax:  [phone]  Name: Ryan Nixon MRN: [id number] Date of Birth: Jun 16, 1960

## 2021-06-23 ENCOUNTER — Other Ambulatory Visit: Payer: Self-pay

## 2021-06-23 ENCOUNTER — Ambulatory Visit: Payer: BC Managed Care – PPO | Admitting: Occupational Therapy

## 2021-06-23 ENCOUNTER — Encounter: Payer: Self-pay | Admitting: Occupational Therapy

## 2021-06-23 DIAGNOSIS — M6281 Muscle weakness (generalized): Secondary | ICD-10-CM | POA: Diagnosis not present

## 2021-06-23 DIAGNOSIS — R278 Other lack of coordination: Secondary | ICD-10-CM

## 2021-06-23 DIAGNOSIS — R208 Other disturbances of skin sensation: Secondary | ICD-10-CM

## 2021-06-23 DIAGNOSIS — R29818 Other symptoms and signs involving the nervous system: Secondary | ICD-10-CM

## 2021-06-23 NOTE — Therapy (Signed)
[facility] [address] [place], [place], [address] Phone: [phone]   Fax:  [phone]  Occupational Therapy Treatment  Patient Details  Name: Ryan Nixon MRN: [id number] Date of Birth: 1960/10/07 Referring Provider (OT): Cortney de Yolanda Manges, NP   Encounter Date: 06/23/2021   OT End of Session - 06/23/21 1020     Visit Number 5    Number of Visits 17    Date for OT Re-Evaluation 08/18/21    Authorization Type BCBS, Aetna    Authorization Time Period VL: combined 60 PT/OT/Chiro (BCBS), VL: 120 (Aetna)    OT Start Time 1018    OT Stop Time 1100    OT Time Calculation (min) 42 min    Activity Tolerance Patient tolerated treatment well    Behavior During Therapy WFL for tasks assessed/performed             Past Medical History:  Diagnosis Date   Headache    Hyperlipidemia    Hypertension     History reviewed. No pertinent surgical history.  There were no vitals filed for this visit.   Subjective Assessment - 06/23/21 1020     Subjective  Pt denies any pain or changes.    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 No/denies                          OT Treatments/Exercises (OP) - 06/23/21 0001       Exercises   Exercises Wrist      Wrist Exercises   Other wrist exercises A/AROM working on activation of wrist extension and MP extension - trace/twitch activation with these movements today. Unable to hold against gravity      Modalities   Modalities Ultrasound      Ultrasound   Ultrasound Location RUE supinator    Ultrasound Parameters 0.8 w/cm2, 8 minutes, 79mhz, 20% duty cycle, pulsed    Ultrasound Goals Other (Comment)                      OT Short Term Goals - 06/15/21 1112       OT SHORT TERM GOAL #1   Title Pt will be independent with HEP    Time 4    Period Weeks     Status On-going    Target Date 07/07/21      OT SHORT TERM GOAL #2   Title Pt will be independent with wear and care of any splints and/or orthoses PRN    Baseline currently with 2 braces from hospital, continue to assess    Time 4    Period Weeks    Status New      OT SHORT TERM GOAL #3   Title Pt will increase grip strength in RUE by 5 lbs or greater    Baseline R 24, L 61.2    Time 4    Period Weeks    Status New      OT SHORT TERM GOAL #4   Title Pt will increase Box and Blocks score with RUE by at least 3 blocks    Baseline R 23 L 52    Time 4    Period Weeks    Status New               OT Long  Term Goals - 06/09/21 1516       OT LONG TERM GOAL #1   Title Pt will be independent with updated HEP    Time 10    Period Weeks    Status New    Target Date 08/18/21      OT LONG TERM GOAL #2   Title Pt will increase grip strength in RUE by 10 lbs or greater    Baseline R 24, L 61.2    Time 10    Period Weeks    Status New      OT LONG TERM GOAL #3   Title Pt will complete 9 hole peg test with RUE in 75 seconds or less.    Baseline R 86.44s, L 22.34s    Time 10    Period Weeks    Status New      OT LONG TERM GOAL #4   Title Pt will demonstrate ability to hold tennis racket in RUE    Time 10    Period Weeks    Status New      OT LONG TERM GOAL #5   Title Pt will verbalize understanding of adapted strategies for increasing independence and safety with ADLs and IADLs (typing, handwriting, buttons, etc)    Time 10    Period Weeks    Status New                   Plan - 06/23/21 1021     Clinical Impression Statement Pt continues to progress - pt with more active supination today.    OT Occupational Profile and History Problem Focused Assessment - Including review of records relating to presenting problem    Occupational performance deficits (Please refer to evaluation for details): ADL's;IADL's;Leisure;Work    Marketing executive / Function /  Physical Skills ADL;Decreased knowledge of use of DME;Strength;Tone;GMC;Dexterity;Proprioception;UE functional use;ROM;IADL;Coordination;FMC    Rehab Potential Good    Clinical Decision Making Limited treatment options, no task modification necessary    Comorbidities Affecting Occupational Performance: None    Modification or Assistance to Complete Evaluation  No modification of tasks or assist necessary to complete eval    OT Frequency 2x / week    OT Duration Other (comment)   16 visits over 10 weeks d/t any scheduling conflicts   OT Treatment/Interventions Self-care/ADL training;Electrical Stimulation;Paraffin;Patient/family education;Manual Therapy;Neuromuscular education;Passive range of motion;Therapeutic exercise;Ultrasound;Contrast Bath;Therapeutic activities;Splinting;DME and/or AE instruction;Fluidtherapy    Plan address any additional splinting needs, radial nerve glides review, decrease to 1x/week    Consulted and Agree with Plan of Care Patient             Patient will benefit from skilled therapeutic intervention in order to improve the following deficits and impairments:   Body Structure / Function / Physical Skills: ADL, Decreased knowledge of use of DME, Strength, Tone, GMC, Dexterity, Proprioception, UE functional use, ROM, IADL, Coordination, [facility]       Visit Diagnosis: Other symptoms and signs involving the nervous system  Muscle weakness (generalized)  Other lack of coordination  Other disturbances of skin sensation    Problem List Patient Active Problem List   Diagnosis Date Noted   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

## 2021-06-23 NOTE — Patient Instructions (Signed)
Composite Flexion (Passive)   Use other hand to bend both joints of thumb at the same time. Hold _10___ seconds. Repeat __5__ times. Do _4-6___ sessions per day.   Composite Extension (Passive)   Using other hand, straighten thumb completely at both joints (AWAY from palm). Hold __10__ seconds. Repeat __5__ times. Do _4-6___ sessions per day.      Abduction (Passive Webspace Stretch)   Stretch thumb away from fingers (to FRONT of palm), using opposite hand. Hold _10___ seconds. Repeat __5__ times. Do __4-6__ sessions per day. Activity: Wrap thumb around coffee mug.*

## 2021-06-25 ENCOUNTER — Ambulatory Visit: Payer: BC Managed Care – PPO | Admitting: Occupational Therapy

## 2021-06-30 ENCOUNTER — Ambulatory Visit: Payer: BC Managed Care – PPO | Admitting: Occupational Therapy

## 2021-06-30 ENCOUNTER — Encounter: Payer: Self-pay | Admitting: Occupational Therapy

## 2021-06-30 ENCOUNTER — Other Ambulatory Visit: Payer: Self-pay

## 2021-06-30 DIAGNOSIS — R278 Other lack of coordination: Secondary | ICD-10-CM | POA: Diagnosis not present

## 2021-06-30 DIAGNOSIS — R29818 Other symptoms and signs involving the nervous system: Secondary | ICD-10-CM | POA: Diagnosis not present

## 2021-06-30 DIAGNOSIS — M6281 Muscle weakness (generalized): Secondary | ICD-10-CM | POA: Diagnosis not present

## 2021-06-30 DIAGNOSIS — R208 Other disturbances of skin sensation: Secondary | ICD-10-CM

## 2021-06-30 NOTE — Therapy (Signed)
[facility] [address] [place], [place], [address] Phone: [phone]   Fax:  [phone]  Occupational Therapy Treatment  Patient Details  Name: Ryan Nixon MRN: [id number] Date of Birth: 09/11/1960 Referring Provider (OT): Cortney de Yolanda Manges, NP   Encounter Date: 06/30/2021   OT End of Session - 06/30/21 1025     Visit Number 6    Number of Visits 17    Date for OT Re-Evaluation 08/18/21    Authorization Type BCBS, Aetna    Authorization Time Period VL: combined 60 PT/OT/Chiro (BCBS), VL: 120 (Aetna)    OT Start Time 1024    OT Stop Time 1100    OT Time Calculation (min) 36 min    Activity Tolerance Patient tolerated treatment well    Behavior During Therapy WFL for tasks assessed/performed             Past Medical History:  Diagnosis Date   Headache    Hyperlipidemia    Hypertension     History reviewed. No pertinent surgical history.  There were no vitals filed for this visit.   Subjective Assessment - 06/30/21 1024     Subjective  Pt reports some pain in volar side of wrist.    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 Yes    Pain Score 3     Pain Location Wrist    Pain Orientation Right    Pain Descriptors / Indicators Sore    Pain Type Acute pain    Pain Onset Today    Pain Frequency Occasional                          OT Treatments/Exercises (OP) - 06/30/21 0001       Electrical Stimulation   Electrical Stimulation Location RUE wrist extension    Electrical Stimulation Parameters Intensity 15, 10 minutes, Trigger NMES small muscle    Electrical Stimulation Goals Strength;Neuromuscular facilitation      Ultrasound   Ultrasound Location RUE supinator    Ultrasound Parameters 0.8 w/cm2, 8 minutes, 70mhz, 20% duty cycle, pulsed    Ultrasound Goals Other (Comment)                       OT Short Term Goals - 06/30/21 1046       OT SHORT TERM GOAL #1   Title Pt will be independent with HEP    Time 4    Period Weeks    Status Achieved    Target Date 07/07/21      OT SHORT TERM GOAL #2   Title Pt will be independent with wear and care of any splints and/or orthoses PRN    Baseline currently with 2 braces from hospital, continue to assess    Time 4    Period Weeks    Status On-going      OT SHORT TERM GOAL #3   Title Pt will increase grip strength in RUE by 5 lbs or greater    Baseline R 24, L 61.2    Time 4    Period Weeks    Status On-going      OT SHORT TERM GOAL #4   Title Pt will increase Box and Blocks score with RUE by at least 3 blocks    Baseline R 23 L  52    Time 4    Period Weeks    Status On-going               OT Long Term Goals - 06/09/21 1516       OT LONG TERM GOAL #1   Title Pt will be independent with updated HEP    Time 10    Period Weeks    Status New    Target Date 08/18/21      OT LONG TERM GOAL #2   Title Pt will increase grip strength in RUE by 10 lbs or greater    Baseline R 24, L 61.2    Time 10    Period Weeks    Status New      OT LONG TERM GOAL #3   Title Pt will complete 9 hole peg test with RUE in 75 seconds or less.    Baseline R 86.44s, L 22.34s    Time 10    Period Weeks    Status New      OT LONG TERM GOAL #4   Title Pt will demonstrate ability to hold tennis racket in RUE    Time 10    Period Weeks    Status New      OT LONG TERM GOAL #5   Title Pt will verbalize understanding of adapted strategies for increasing independence and safety with ADLs and IADLs (typing, handwriting, buttons, etc)    Time 10    Period Weeks    Status New                   Plan - 06/30/21 1049     Clinical Impression Statement Pt reports increased sensation pain in RUE today. Pt with good response to estim.    OT Occupational Profile and History Problem Focused Assessment -  Including review of records relating to presenting problem    Occupational performance deficits (Please refer to evaluation for details): ADL's;IADL's;Leisure;Work    Marketing executive / Function / Physical Skills ADL;Decreased knowledge of use of DME;Strength;Tone;GMC;Dexterity;Proprioception;UE functional use;ROM;IADL;Coordination;FMC    Rehab Potential Good    Clinical Decision Making Limited treatment options, no task modification necessary    Comorbidities Affecting Occupational Performance: None    Modification or Assistance to Complete Evaluation  No modification of tasks or assist necessary to complete eval    OT Frequency 2x / week    OT Duration Other (comment)   16 visits over 10 weeks d/t any scheduling conflicts   OT Treatment/Interventions Self-care/ADL training;Electrical Stimulation;Paraffin;Patient/family education;Manual Therapy;Neuromuscular education;Passive range of motion;Therapeutic exercise;Ultrasound;Contrast Bath;Therapeutic activities;Splinting;DME and/or AE instruction;Fluidtherapy    Plan check STGs, continue to review splinting needs, review radial nerve glides, estim, ultrasound    Consulted and Agree with Plan of Care Patient             Patient will benefit from skilled therapeutic intervention in order to improve the following deficits and impairments:   Body Structure / Function / Physical Skills: ADL, Decreased knowledge of use of DME, Strength, Tone, GMC, Dexterity, Proprioception, UE functional use, ROM, IADL, Coordination, [facility]       Visit Diagnosis: Other symptoms and signs involving the nervous system  Muscle weakness (generalized)  Other lack of coordination  Other disturbances of skin sensation    Problem List Patient Active Problem List   Diagnosis Date Noted   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 [place], [place], [address] Phone: [phone]   Fax:  [phone]  Name: Ryan Nixon MRN: [id number] Date of Birth: Feb 13, 1961

## 2021-07-02 ENCOUNTER — Encounter: Payer: BC Managed Care – PPO | Admitting: Occupational Therapy

## 2021-07-07 ENCOUNTER — Ambulatory Visit: Payer: BC Managed Care – PPO | Attending: Family Medicine | Admitting: Occupational Therapy

## 2021-07-07 ENCOUNTER — Other Ambulatory Visit: Payer: Self-pay

## 2021-07-07 ENCOUNTER — Encounter: Payer: Self-pay | Admitting: Occupational Therapy

## 2021-07-07 DIAGNOSIS — R29818 Other symptoms and signs involving the nervous system: Secondary | ICD-10-CM | POA: Diagnosis not present

## 2021-07-07 DIAGNOSIS — M6281 Muscle weakness (generalized): Secondary | ICD-10-CM | POA: Insufficient documentation

## 2021-07-07 DIAGNOSIS — R208 Other disturbances of skin sensation: Secondary | ICD-10-CM | POA: Diagnosis not present

## 2021-07-07 DIAGNOSIS — R278 Other lack of coordination: Secondary | ICD-10-CM | POA: Diagnosis not present

## 2021-07-07 NOTE — Therapy (Signed)
[facility] [facility], [place], [address] ?Phone: [phone]   Fax:  [phone] ? ?Occupational Therapy Treatment ? ?Patient Details  ?Name: Ryan Nixon ?MRN: [id number] ?Date of Birth: 16-Sep-1960 ?Referring Provider (OT): Cortney de Yolanda Manges, NP ? ? ?Encounter Date: 07/07/2021 ? ? OT End of Session - 07/07/21 1312   ? ? Visit Number 7   ? Number of Visits 17   ? Date for OT Re-Evaluation 08/18/21   ? Authorization Type BCBS, Aetna   ? Authorization Time Period VL: combined 60 PT/OT/Chiro (BCBS), VL: 120 (Aetna)   ? OT Start Time 1100   ? OT Stop Time 1140   ? OT Time Calculation (min) 40 min   ? ?  ?  ? ?  ? ? ?Past Medical History:  ?Diagnosis Date  ? Headache   ? Hyperlipidemia   ? Hypertension   ? ? ?History reviewed. No pertinent surgical history. ? ?There were no vitals filed for this visit. ? ? Subjective Assessment - 07/07/21 1107   ? ? Subjective  Patient reports pain in volar and dorsal surface of wrist   ? Pertinent History HTN, HLD   ? 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 Currently in Pain? No/denies   ? Pain Score 0-No pain   ? ?  ?  ? ?  ? ? ? ? ? ? ? ? ? ? ? ? ? ? ? OT Treatments/Exercises (OP) - 07/07/21 0001   ? ?  ? Neurological Re-education Exercises  ? Other Exercises 1 Patient showing ability to extend wrist from full flexion - better in gravity neutral position, then against gravity.  Patient alos showing some MCP extension.   Worked on increasing his awareness of his activation pattern.  Patient uses strong wrist and digit flexion with attempts to extend.  Patient overuses shoulder musculature and even holds breath at times.  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   ?  ? Manual Therapy  ? Manual Therapy Joint mobilization   ? Manual therapy comments Gentle mobilization to carpals in right wrist.  Patient with point  tenderness at scaphoid - and mid row carpals.  Patient overuses flexors and pronation with attempts to activate extensors.   ? ?  ?  ? ?  ? ? ? ? ? ? ? ? ? ? ? OT Short Term Goals - 07/07/21 1315   ? ?  ? OT SHORT TERM GOAL #1  ? Title Pt will be independent with HEP   ? Time 4   ? Period Weeks   ? Status Achieved   ? Target Date 07/07/21   ?  ? OT SHORT TERM GOAL #2  ? Title Pt will be independent with wear and care of any splints and/or orthoses PRN   ? Baseline currently with 2 braces from hospital, continue to assess   ? Time 4   ? Period Weeks   ? Status Achieved   ?  ? OT SHORT TERM GOAL #3  ? Title Pt will increase grip strength in RUE by 5 lbs or greater   ? Baseline R 24, L 61.2   ? Time 4   ? Period Weeks   ? Status On-going   ?  ? OT SHORT TERM GOAL #4  ? Title Pt will increase Box and Blocks score with RUE  by at least 3 blocks   ? Baseline R 23 L 52   ? Time 4   ? Period Weeks   ? Status On-going   ? ?  ?  ? ?  ? ? ? ? OT Long Term Goals - 07/07/21 1316   ? ?  ? OT LONG TERM GOAL #1  ? Title Pt will be independent with updated HEP   ? Time 10   ? Period Weeks   ? Status On-going   ? Target Date 08/18/21   ?  ? OT LONG TERM GOAL #2  ? Title Pt will increase grip strength in RUE by 10 lbs or greater   ? Baseline R 24, L 61.2   ? Time 10   ? Period Weeks   ? Status On-going   ?  ? OT LONG TERM GOAL #3  ? Title Pt will complete 9 hole peg test with RUE in 75 seconds or less.   ? Baseline R 86.44s, L 22.34s   ? Time 10   ? Period Weeks   ? Status On-going   ?  ? OT LONG TERM GOAL #4  ? Title Pt will demonstrate ability to hold tennis racket in RUE   ? Time 10   ? Period Weeks   ? Status On-going   ?  ? OT LONG TERM GOAL #5  ? Title Pt will verbalize understanding of adapted strategies for increasing independence and safety with ADLs and IADLs (typing, handwriting, buttons, etc)   ? Time 10   ? Period Weeks   ? Status On-going   ? ?  ?  ? ?  ? ? ? ? ? ? ? ? Plan - 07/07/21 1314   ? ? Clinical Impression  Statement Pt showing early wrist and trace digit extension (MCP) today.  Patient dilligent with wearing splint and exercising.   ? OT Occupational Profile and History Problem Focused Assessment - Including review of records relating to presenting problem   ? Occupational performance deficits (Please refer to evaluation for details): ADL's;IADL's;Leisure;Work   ? Body Structure / Function / Physical Skills ADL;Decreased knowledge of use of DME;Strength;Tone;GMC;Dexterity;Proprioception;UE functional use;ROM;IADL;Coordination;FMC   ? Rehab Potential Good   ? Clinical Decision Making Limited treatment options, no task modification necessary   ? Comorbidities Affecting Occupational Performance: None   ? Modification or Assistance to Complete Evaluation  No modification of tasks or assist necessary to complete eval   ? OT Frequency 2x / week   ? OT Duration Other (comment)   16 visits over 10 weeks d/t any scheduling conflicts  ? OT Treatment/Interventions Self-care/ADL training;Electrical Stimulation;Paraffin;Patient/family education;Manual Therapy;Neuromuscular education;Passive range of motion;Therapeutic exercise;Ultrasound;Contrast Bath;Therapeutic activities;Splinting;DME and/or AE instruction;Fluidtherapy   ? Plan check STGs, continue to review splinting needs, review radial nerve glides, estim, ultrasound   ? Consulted and Agree with Plan of Care Patient   ? ?  ?  ? ?  ? ? ?Patient will benefit from skilled therapeutic intervention in order to improve the following deficits and impairments:   ?Body Structure / Function / Physical Skills: ADL, Decreased knowledge of use of DME, Strength, Tone, GMC, Dexterity, Proprioception, UE functional use, ROM, IADL, Coordination, FMC ?  ?  ? ? ?Visit Diagnosis: ?Muscle weakness (generalized) ? ?Other lack of coordination ? ?Other disturbances of skin sensation ? ?Other symptoms and signs involving the nervous system ? ? ? ?Problem List ?Patient Active Problem List  ?  Diagnosis Date Noted  ? 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 ? ?Mariah Milling, OT ?07/07/2021, 1:16 PM ? ?[facility] ?[place], [place], [address] ?Phone: [phone]   Fax:  [phone] ? ?Name: Ryan Nixon ?MRN: [id number] ?Date of Birth: [birth year] ? ?

## 2021-07-09 ENCOUNTER — Encounter: Payer: BC Managed Care – PPO | Admitting: Occupational Therapy

## 2021-07-14 ENCOUNTER — Encounter: Payer: Self-pay | Admitting: Occupational Therapy

## 2021-07-14 ENCOUNTER — Ambulatory Visit: Payer: BC Managed Care – PPO | Admitting: Occupational Therapy

## 2021-07-14 ENCOUNTER — Other Ambulatory Visit: Payer: Self-pay

## 2021-07-14 DIAGNOSIS — R208 Other disturbances of skin sensation: Secondary | ICD-10-CM | POA: Diagnosis not present

## 2021-07-14 DIAGNOSIS — R278 Other lack of coordination: Secondary | ICD-10-CM | POA: Diagnosis not present

## 2021-07-14 DIAGNOSIS — M6281 Muscle weakness (generalized): Secondary | ICD-10-CM | POA: Diagnosis not present

## 2021-07-14 DIAGNOSIS — R29818 Other symptoms and signs involving the nervous system: Secondary | ICD-10-CM

## 2021-07-14 NOTE — Therapy (Signed)
[place] ?[place] ?[place], [place], [address] ?Phone: [phone]   Fax:  [phone] ? ?Occupational Therapy Treatment ? ?Patient Details  ?Name: Ryan Nixon ?MRN: [id number] ?Date of Birth: [birth year] ?Referring Provider (OT): Cortney de Yolanda Manges, NP ? ? ?Encounter Date: 07/14/2021 ? ? OT End of Session - 07/14/21 1159   ? ? Visit Number 8   ? Number of Visits 17   ? Date for OT Re-Evaluation 08/18/21   ? Authorization Type BCBS, Aetna   ? Authorization Time Period VL: combined 60 PT/OT/[place]iro (BCBS), VL: 120 (Aetna)   ? OT Start Time 1017   ? OT Stop Time 1100   ? OT Time Calculation (min) 43 min   ? Activity Tolerance Patient tolerated treatment well   ? Behavior During Therapy [facility] for tasks assessed/performed   ? ?  ?  ? ?  ? ? ?Past Medical History:  ?Diagnosis Date  ? Headache   ? Hyperlipidemia   ? Hypertension   ? ? ?History reviewed. No pertinent surgical history. ? ?There were no vitals filed for this visit. ? ? Subjective Assessment - 07/14/21 1024   ? ? Subjective  Patient reports pain in forearm and dorsum of hand - reports using it more   ? Pertinent History HTN, HLD   ? 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 Currently in Pain? Yes   ? Pain Score 4    ? Pain Location Arm   ? Pain Orientation Right   ? Pain Descriptors / Indicators Sore   ? Pain Type Acute pain   ? Pain Onset In the past 7 days   ? Pain Frequency Intermittent   ? Aggravating Factors  use   ? Pain Relieving Factors rest   ? ?  ?  ? ?  ? ? ? ? ? ? ? ? ? ? ? ? ? ? ? OT Treatments/Exercises (OP) - 07/14/21 0001   ? ?  ? Neurological Re-education Exercises  ? Other Exercises 1 Patient continuing to demonstrate movement toward wrist extension from full wrist flexion.  Patient can extend wrist to neutral against gravity, then beyond neutral with active assistance.  Patient continues to use strong compensatory movements such as pronation  and wrist flexion.   ? Other Exercises 2 Checked short term goasl and some long term goals.  Patient is showing steady improvement.  Did testing with and without radila nerve pasly splint on and has improved grip strength and more precision with FM tasks at this time with brace in place.   ? ?  ?  ? ?  ? ? ? ? ? ? ? ? ? OT Education - 07/14/21 1158   ? ? Education Details use of Radial nerve brace when needing power or precision in right UE   ? Person(s) Educated Patient   ? Methods Explanation;Demonstration   ? Comprehension Verbalized understanding   ? ?  ?  ? ?  ? ? ? OT Short Term Goals - 07/14/21 1202   ? ?  ? OT SHORT TERM GOAL #1  ? Title Pt will be independent with HEP   ? Time 4   ? Period Weeks   ? Status Achieved   ? Target Date 07/07/21   ?  ? OT SHORT TERM GOAL #2  ? Title Pt will be independent with wear and care of any splints and/or orthoses PRN   ?  Baseline currently with 2 braces from hospital, continue to assess   ? Time 4   ? Period Weeks   ? Status Achieved   ?  ? OT SHORT TERM GOAL #3  ? Title Pt will increase grip strength in RUE by 5 lbs or greater   ? Baseline R 24, L 61.2       3/14 22.9 R without splint, 32.6 R with radial nerve palsy splint   22.9 07/14/21  ? Time 4   ? Period Weeks   ? Status Not Met   ?  ? OT SHORT TERM GOAL #4  ? Title Pt will increase Box and Blocks score with RUE by at least 3 blocks   ? Baseline R 23 L 52  3/14 - 53 blocks RUE   ? Time 4   ? Period Weeks   ? Status Achieved   ? ?  ?  ? ?  ? ? ? ? OT Long Term Goals - 07/14/21 1040   ? ?  ? OT LONG TERM GOAL #1  ? Title Pt will be independent with updated HEP   ? Time 10   ? Period Weeks   ? Status On-going   ? Target Date 08/18/21   ?  ? OT LONG TERM GOAL #2  ? Title Pt will increase grip strength in RUE by 10 lbs or greater   ? Baseline R 24, L 61.2   ? Time 10   ? Period Weeks   ? Status On-going   ?  ? OT LONG TERM GOAL #3  ? Title Pt will complete 9 hole peg test with RUE in 75 seconds or less.   ? Baseline R  86.44s, L 22.34s,    3/14 w/ splint - 24.35sec,   w/o splint 30.22 sec   ? Time 10   ? Period Weeks   ? Status Achieved   ?  ? OT LONG TERM GOAL #4  ? Title Pt will demonstrate ability to hold tennis racket in RUE   ? Time 10   ? Period Weeks   ? Status On-going   ?  ? OT LONG TERM GOAL #5  ? Title Pt will verbalize understanding of adapted strategies for increasing independence and safety with ADLs and IADLs (typing, handwriting, buttons, etc)   ? Time 10   ? Period Weeks   ? Status On-going   ? ?  ?  ? ?  ? ? ? ? ? ? ? ? Plan - 07/14/21 1159   ? ? Clinical Impression Statement Pt continues to slow slow and steady improvement (as expected)  with wrist extension, MCP extension   ? OT Occupational Profile and History Problem Focused Assessment - Including review of records relating to presenting problem   ? Occupational performance deficits (Please refer to evaluation for details): ADL's;IADL's;Leisure;Work   ? Body Structure / Function / Physical Skills ADL;Decreased knowledge of use of DME;Strength;Tone;GMC;Dexterity;Proprioception;UE functional use;ROM;IADL;Coordination;FMC   ? Rehab Potential Good   ? Clinical Decision Making Limited treatment options, no task modification necessary   ? Comorbidities Affecting Occupational Performance: None   ? Modification or Assistance to Complete Evaluation  No modification of tasks or assist necessary to complete eval   ? OT Frequency 2x / week   ? OT Duration Other (comment)   16 visits over 10 weeks d/t any scheduling conflicts  ? OT Treatment/Interventions Self-care/ADL training;Electrical Stimulation;Paraffin;Patient/family education;Manual Therapy;Neuromuscular education;Passive range of motion;Therapeutic exercise;Ultrasound;Contrast Bath;Therapeutic activities;Splinting;DME and/or AE  instruction;Fluidtherapy   ? Plan Try use of grip on racket, continue to discuss splinting schedule - when to wear, not wear, etc assess if splint needs padding for comfort, review radial  nerve glides, ultrasound   ? Consulted and Agree with Plan of Care Patient   ? ?  ?  ? ?  ? ? ?Patient will benefit from skilled therapeutic intervention in order to improve the following deficits and impairments:   ?Body Structure / Function / Physical Skills: ADL, Decreased knowledge of use of DME, Strength, Tone, GMC, Dexterity, Proprioception, UE functional use, ROM, IADL, Coordination, FMC ?  ?  ? ? ?Visit Diagnosis: ?Muscle weakness (generalized) ? ?Other lack of coordination ? ?Other disturbances of skin sensation ? ?Other symptoms and signs involving the nervous system ? ? ? ?Problem List ?Patient Active Problem List  ? Diagnosis Date Noted  ? 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 ? ?Ryan Nixon, OT ?07/14/2021, 12:02 PM ? ?[place] ?[place] ?[place], [place], [address] ?Phone: [phone]   Fax:  [phone] ? ?Name: Ryan Nixon ?MRN: [id number] ?Date of Birth: 06-Jul-1960 ? ?

## 2021-07-16 ENCOUNTER — Encounter: Payer: BC Managed Care – PPO | Admitting: Occupational Therapy

## 2021-07-21 ENCOUNTER — Ambulatory Visit: Payer: BC Managed Care – PPO | Admitting: Occupational Therapy

## 2021-07-21 ENCOUNTER — Encounter: Payer: Self-pay | Admitting: Occupational Therapy

## 2021-07-21 ENCOUNTER — Other Ambulatory Visit: Payer: Self-pay

## 2021-07-21 DIAGNOSIS — M6281 Muscle weakness (generalized): Secondary | ICD-10-CM

## 2021-07-21 DIAGNOSIS — R29818 Other symptoms and signs involving the nervous system: Secondary | ICD-10-CM | POA: Diagnosis not present

## 2021-07-21 DIAGNOSIS — R208 Other disturbances of skin sensation: Secondary | ICD-10-CM

## 2021-07-21 DIAGNOSIS — R278 Other lack of coordination: Secondary | ICD-10-CM | POA: Diagnosis not present

## 2021-07-21 NOTE — Therapy (Signed)
[place] ?[place] ?[place], [place], [address] ?Phone: [phone]   Fax:  [phone] ? ?Occupational Therapy Treatment ? ?Patient Details  ?Name: Ryan Nixon ?MRN: [id number] ?Date of Birth: 1960-12-14 ?Referring Provider (OT): Cortney de Yolanda Manges, NP ? ? ?Encounter Date: 07/21/2021 ? ? OT End of Session - 07/21/21 1021   ? ? Visit Number 9   ? Number of Visits 17   ? Date for OT Re-Evaluation 08/18/21   ? Authorization Type BCBS, Aetna   ? Authorization Time Period VL: combined 60 PT/OT/[place] (BCBS), VL: 120 (Aetna)   ? OT Start Time 1020   ? OT Stop Time 1100   ? OT Time Calculation (min) 40 min   ? Activity Tolerance Patient tolerated treatment well   ? Behavior During Therapy [facility] for tasks assessed/performed   ? ?  ?  ? ?  ? ? ?Past Medical History:  ?Diagnosis Date  ? Headache   ? Hyperlipidemia   ? Hypertension   ? ? ?History reviewed. No pertinent surgical history. ? ?There were no vitals filed for this visit. ? ? Subjective Assessment - 07/21/21 1020   ? ? Subjective  pt arrived with new brace on RUE - reports it's a little painful but needs to get used to it.   ? Pertinent History HTN, HLD   ? 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 Currently in Pain? Yes   with suddent movements  ? Pain Score 4    ? Pain Location Arm   ? Pain Orientation Right   ? Pain Descriptors / Indicators Aching;Sore   ? Pain Type Acute pain   ? Pain Onset In the past 7 days   ? Pain Frequency Intermittent   ? Aggravating Factors  suddent movements   ? ?  ?  ? ?  ? ? ? ? ? ? ? ? ? ? ? ? ? ? ? OT Treatments/Exercises (OP) - 07/21/21 1023   ? ?  ? Exercises  ? Exercises Wrist   ?  ? Wrist Exercises  ? Wrist Extension PROM   ? Wrist Extension Limitations prayer stretch and weight bearing into counter for increased wrist extension PROM   ? Other wrist exercises AROM wrist extension (to neutral) and supination x 12 reps   ? Other wrist  exercises A/AROM wrist extension x 12, stablization with power web and ball and then raquetball racket and ball for managing grip with RUE and stabilization for balancing ball.   ?  ? Modalities  ? Modalities Electrical Stimulation   ?  ? Electrical Stimulation  ? Electrical Stimulation Location RUE wrist extension   ? Electrical Stimulation Parameters Unable to tolerated intensity necessary for wanted movement any greater than 10 - discontinued this day   ?  ? Manual Therapy  ? Manual Therapy Soft tissue mobilization;Edema management   ? Edema Management retrograde massage for edema at RUE   ? Soft tissue mobilization with lotion for increasing blood flow at radial tunnel   ? ?  ?  ? ?  ? ? ? ? ? ? ? ? ? ? ? OT Short Term Goals - 07/14/21 1202   ? ?  ? OT SHORT TERM GOAL #1  ? Title Pt will be independent with HEP   ? Time 4   ? Period Weeks   ? Status Achieved   ? Target Date 07/07/21   ?  ?  OT SHORT TERM GOAL #2  ? Title Pt will be independent with wear and care of any splints and/or orthoses PRN   ? Baseline currently with 2 braces from hospital, continue to assess   ? Time 4   ? Period Weeks   ? Status Achieved   ?  ? OT SHORT TERM GOAL #3  ? Title Pt will increase grip strength in RUE by 5 lbs or greater   ? Baseline R 24, L 61.2       3/14 22.9 R without splint, 32.6 R with radial nerve palsy splint   22.9 07/14/21  ? Time 4   ? Period Weeks   ? Status Not Met   ?  ? OT SHORT TERM GOAL #4  ? Title Pt will increase Box and Blocks score with RUE by at least 3 blocks   ? Baseline R 23 L 52  3/14 - 53 blocks RUE   ? Time 4   ? Period Weeks   ? Status Achieved   ? ?  ?  ? ?  ? ? ? ? OT Long Term Goals - 07/14/21 1040   ? ?  ? OT LONG TERM GOAL #1  ? Title Pt will be independent with updated HEP   ? Time 10   ? Period Weeks   ? Status On-going   ? Target Date 08/18/21   ?  ? OT LONG TERM GOAL #2  ? Title Pt will increase grip strength in RUE by 10 lbs or greater   ? Baseline R 24, L 61.2   ? Time 10   ? Period Weeks    ? Status On-going   ?  ? OT LONG TERM GOAL #3  ? Title Pt will complete 9 hole peg test with RUE in 75 seconds or less.   ? Baseline R 86.44s, L 22.34s,    3/14 w/ splint - 24.35sec,   w/o splint 30.22 sec   ? Time 10   ? Period Weeks   ? Status Achieved   ?  ? OT LONG TERM GOAL #4  ? Title Pt will demonstrate ability to hold tennis racket in RUE   ? Time 10   ? Period Weeks   ? Status On-going   ?  ? OT LONG TERM GOAL #5  ? Title Pt will verbalize understanding of adapted strategies for increasing independence and safety with ADLs and IADLs (typing, handwriting, buttons, etc)   ? Time 10   ? Period Weeks   ? Status On-going   ? ?  ?  ? ?  ? ? ? ? ? ? ? ? Plan - 07/21/21 1102   ? ? Clinical Impression Statement Pt with slow and steady progress with increased active movement of wrist extension to neutral against gravity and ability to grasp and hold racket some.   ? OT Occupational Profile and History Problem Focused Assessment - Including review of records relating to presenting problem   ? Occupational performance deficits (Please refer to evaluation for details): ADL's;IADL's;Leisure;Work   ? Body Structure / Function / Physical Skills ADL;Decreased knowledge of use of DME;Strength;Tone;GMC;Dexterity;Proprioception;UE functional use;ROM;IADL;Coordination;FMC   ? Rehab Potential Good   ? Clinical Decision Making Limited treatment options, no task modification necessary   ? Comorbidities Affecting Occupational Performance: None   ? Modification or Assistance to Complete Evaluation  No modification of tasks or assist necessary to complete eval   ? OT Frequency 2x / week   ?  OT Duration Other (comment)   16 visits over 10 weeks d/t any scheduling conflicts  ? OT Treatment/Interventions Self-care/ADL training;Electrical Stimulation;Paraffin;Patient/family education;Manual Therapy;Neuromuscular education;Passive range of motion;Therapeutic exercise;Ultrasound;Contrast Bath;Therapeutic activities;Splinting;DME and/or  AE instruction;Fluidtherapy   ? Plan Try use of grip on racket, continue to discuss splinting schedule - when to wear, not wear, etc assess if splint needs padding for comfort, review radial nerve glides   ? Consulted and Agree with Plan of Care Patient   ? ?  ?  ? ?  ? ? ?Patient will benefit from skilled therapeutic intervention in order to improve the following deficits and impairments:   ?Body Structure / Function / Physical Skills: ADL, Decreased knowledge of use of DME, Strength, Tone, GMC, Dexterity, Proprioception, UE functional use, ROM, IADL, Coordination, FMC ?  ?  ? ? ?Visit Diagnosis: ?Other lack of coordination ? ?Other disturbances of skin sensation ? ?Other symptoms and signs involving the nervous system ? ?Muscle weakness (generalized) ? ? ? ?Problem List ?Patient Active Problem List  ? Diagnosis Date Noted  ? 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 ? ?Zachery Conch, OT ?07/21/2021, 11:03 AM ? ?[place] ?[place] ?[place], [place], [address] ?Phone: [phone]   Fax:  [phone] ? ?Name: Ryan Nixon ?MRN: [id number] ?Date of Birth: 1960-09-07 ? ?

## 2021-07-23 ENCOUNTER — Encounter: Payer: BC Managed Care – PPO | Admitting: Occupational Therapy

## 2021-07-28 ENCOUNTER — Encounter: Payer: Self-pay | Admitting: Occupational Therapy

## 2021-07-28 ENCOUNTER — Ambulatory Visit: Payer: BC Managed Care – PPO | Admitting: Occupational Therapy

## 2021-07-28 ENCOUNTER — Other Ambulatory Visit: Payer: Self-pay

## 2021-07-28 DIAGNOSIS — R208 Other disturbances of skin sensation: Secondary | ICD-10-CM | POA: Diagnosis not present

## 2021-07-28 DIAGNOSIS — R29818 Other symptoms and signs involving the nervous system: Secondary | ICD-10-CM

## 2021-07-28 DIAGNOSIS — R278 Other lack of coordination: Secondary | ICD-10-CM | POA: Diagnosis not present

## 2021-07-28 DIAGNOSIS — M6281 Muscle weakness (generalized): Secondary | ICD-10-CM | POA: Diagnosis not present

## 2021-07-28 NOTE — Therapy (Signed)
[place] ?[place] ?[place], [place], [address] ?Phone: [phone]   Fax:  [phone] ? ?Occupational Therapy Treatment ? ?Patient Details  ?Name: Ryan Nixon ?MRN: [id number] ?Date of Birth: Mar 02, 1961 ?Referring Provider (OT): Cortney de Yolanda Manges, NP ? ? ?Encounter Date: 07/28/2021 ? ? OT End of Session - 07/28/21 1104   ? ? Visit Number 10   ? Number of Visits 17   ? Date for OT Re-Evaluation 08/18/21   ? Authorization Type BCBS, Aetna   ? Authorization Time Period VL: combined 60 PT/OT/[place] (BCBS), VL: 120 (Aetna)   ? OT Start Time 1104   ? OT Stop Time 1145   ? OT Time Calculation (min) 41 min   ? Activity Tolerance Patient tolerated treatment well   ? Behavior During Therapy [facility] for tasks assessed/performed   ? ?  ?  ? ?  ? ? ?Past Medical History:  ?Diagnosis Date  ? Headache   ? Hyperlipidemia   ? Hypertension   ? ? ?History reviewed. No pertinent surgical history. ? ?There were no vitals filed for this visit. ? ? ? ?Fluidotherapy x 12 minutes for RUE to address pain, swelling, and stiffness. No adverse reactions.  ? ?Supination/Pronator Wheel in pain free zone with RUE and with no adverse reactions.  ? ?Grasp and Release with medium cones with RUE with maintaining RUE wrist in neutral with min compensations with wrist flexion for extension and release of cones. Pt demonstrating improved ability to maintaining neutral wrist for grasp and release vs compensatory movements.  ? ?Arm Bike: for conditioning and reciprocal movements on Level 1 for 6 minutes (forward and backward) ? ? ? ? ? ? ? ? ? ? ? ? ? ? ? ? ? ? ? ? OT Short Term Goals - 07/14/21 1202   ? ?  ? OT SHORT TERM GOAL #1  ? Title Pt will be independent with HEP   ? Time 4   ? Period Weeks   ? Status Achieved   ? Target Date 07/07/21   ?  ? OT SHORT TERM GOAL #2  ? Title Pt will be independent with wear and care of any splints and/or orthoses PRN   ? Baseline currently with 2  braces from hospital, continue to assess   ? Time 4   ? Period Weeks   ? Status Achieved   ?  ? OT SHORT TERM GOAL #3  ? Title Pt will increase grip strength in RUE by 5 lbs or greater   ? Baseline R 24, L 61.2       3/14 22.9 R without splint, 32.6 R with radial nerve palsy splint   22.9 07/14/21  ? Time 4   ? Period Weeks   ? Status Not Met   ?  ? OT SHORT TERM GOAL #4  ? Title Pt will increase Box and Blocks score with RUE by at least 3 blocks   ? Baseline R 23 L 52  3/14 - 53 blocks RUE   ? Time 4   ? Period Weeks   ? Status Achieved   ? ?  ?  ? ?  ? ? ? ? OT Long Term Goals - 07/14/21 1040   ? ?  ? OT LONG TERM GOAL #1  ? Title Pt will be independent with updated HEP   ? Time 10   ? Period Weeks   ? Status On-going   ? Target Date 08/18/21   ?  ?  OT LONG TERM GOAL #2  ? Title Pt will increase grip strength in RUE by 10 lbs or greater   ? Baseline R 24, L 61.2   ? Time 10   ? Period Weeks   ? Status On-going   ?  ? OT LONG TERM GOAL #3  ? Title Pt will complete 9 hole peg test with RUE in 75 seconds or less.   ? Baseline R 86.44s, L 22.34s,    3/14 w/ splint - 24.35sec,   w/o splint 30.22 sec   ? Time 10   ? Period Weeks   ? Status Achieved   ?  ? OT LONG TERM GOAL #4  ? Title Pt will demonstrate ability to hold tennis racket in RUE   ? Time 10   ? Period Weeks   ? Status On-going   ?  ? OT LONG TERM GOAL #5  ? Title Pt will verbalize understanding of adapted strategies for increasing independence and safety with ADLs and IADLs (typing, handwriting, buttons, etc)   ? Time 10   ? Period Weeks   ? Status On-going   ? ?  ?  ? ?  ? ? ? ? ? ? ? ? Plan - 07/28/21 1226   ? ? Clinical Impression Statement Pt demonstrating improved RUE active movement and control.   ? OT Occupational Profile and History Problem Focused Assessment - Including review of records relating to presenting problem   ? Occupational performance deficits (Please refer to evaluation for details): ADL's;IADL's;Leisure;Work   ? Body Structure /  Function / Physical Skills ADL;Decreased knowledge of use of DME;Strength;Tone;GMC;Dexterity;Proprioception;UE functional use;ROM;IADL;Coordination;FMC   ? Rehab Potential Good   ? Clinical Decision Making Limited treatment options, no task modification necessary   ? Comorbidities Affecting Occupational Performance: None   ? Modification or Assistance to Complete Evaluation  No modification of tasks or assist necessary to complete eval   ? OT Frequency 2x / week   ? OT Duration Other (comment)   16 visits over 10 weeks d/t any scheduling conflicts  ? OT Treatment/Interventions Self-care/ADL training;Electrical Stimulation;Paraffin;Patient/family education;Manual Therapy;Neuromuscular education;Passive range of motion;Therapeutic exercise;Ultrasound;Contrast Bath;Therapeutic activities;Splinting;DME and/or AE instruction;Fluidtherapy   ? Plan continue progressing towards goals   ? Consulted and Agree with Plan of Care Patient   ? ?  ?  ? ?  ? ? ?Patient will benefit from skilled therapeutic intervention in order to improve the following deficits and impairments:   ?Body Structure / Function / Physical Skills: ADL, Decreased knowledge of use of DME, Strength, Tone, GMC, Dexterity, Proprioception, UE functional use, ROM, IADL, Coordination, FMC ?  ?  ? ? ?Visit Diagnosis: ?Other lack of coordination ? ?Other symptoms and signs involving the nervous system ? ?Muscle weakness (generalized) ? ?Other disturbances of skin sensation ? ? ? ?Problem List ?Patient Active Problem List  ? Diagnosis Date Noted  ? 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 ? ?Zachery Conch, OT ?07/28/2021, 12:26 PM ? ?[place] ?[place] ?[place], [place], [address] ?Phone: [phone]   Fax:  [phone] ? ?Name: Estiben Mizuno ?MRN: [id number] ?Date of Birth: Nov 10, 1960 ? ?

## 2021-08-04 ENCOUNTER — Encounter: Payer: Self-pay | Admitting: Occupational Therapy

## 2021-08-04 ENCOUNTER — Ambulatory Visit: Payer: BC Managed Care – PPO | Attending: Family Medicine | Admitting: Occupational Therapy

## 2021-08-04 DIAGNOSIS — M6281 Muscle weakness (generalized): Secondary | ICD-10-CM | POA: Diagnosis not present

## 2021-08-04 DIAGNOSIS — R29818 Other symptoms and signs involving the nervous system: Secondary | ICD-10-CM | POA: Diagnosis not present

## 2021-08-04 DIAGNOSIS — R208 Other disturbances of skin sensation: Secondary | ICD-10-CM | POA: Diagnosis not present

## 2021-08-04 DIAGNOSIS — R278 Other lack of coordination: Secondary | ICD-10-CM | POA: Diagnosis not present

## 2021-08-04 NOTE — Therapy (Signed)
?OUTPATIENT OCCUPATIONAL THERAPY TREATMENT NOTE ? ? ?Patient Name: Ryan Nixon ?MRN: [id number] ?DOB:Apr 30, 1961, 61 y.o., male ?Today's Date: 08/04/2021 ? ?PCP: London Pepper, MD ?REFERRING PROVIDER: London Pepper, MD ? ? OT End of Session - 08/04/21 1103   ? ? Visit Number 11   ? Number of Visits 17   ? Date for OT Re-Evaluation 08/18/21   ? Authorization Type BCBS, Aetna   ? Authorization Time Period VL: combined 60 PT/OT/Chiro (BCBS), VL: 120 (Aetna)   ? OT Start Time 1102   ? OT Stop Time 1145   ? OT Time Calculation (min) 43 min   ? Activity Tolerance Patient tolerated treatment well   ? Behavior During Therapy [facility] for tasks assessed/performed   ? ?  ?  ? ?  ? ? ?Past Medical History:  ?Diagnosis Date  ? Headache   ? Hyperlipidemia   ? Hypertension   ? ?History reviewed. No pertinent surgical history. ?Patient Active Problem List  ? Diagnosis Date Noted  ? 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 ? ?ONSET DATE: 06/01/21 ? ?REFERRING DIAG: R Radial Nerve Palsy  ? ?THERAPY DIAG:  ?Other lack of coordination ? ?Other symptoms and signs involving the nervous system ? ?Muscle weakness (generalized) ? ?Other disturbances of skin sensation ? ? ?PERTINENT HISTORY: HTN, HLD  ? ?PRECAUTIONS: None ? ?SUBJECTIVE: "i'm having more pain" ? ?PAIN:  ?Are you having pain? Yes: NPRS scale: 4/10 ?Pain location: thenar region of palm ?Pain description: stretch pain ?Aggravating factors: with stretch/wrist extension ?Relieving factors: stopping/resting ? ? ? ? ?OBJECTIVE:  ? ?TODAY'S TREATMENT:  ? ?08/04/21 ? ?Ultrasound ? 0.8w/cm2, 8 minutes, 3.3 mhz, 20% duty cycle - RUE supinator muscle belly ? 0.8w/cm2, 8 minutes, 1 mhz, 20% duty cycle - RUE thenar palm ? ?Resistance Clothespins 1-8# with RUE for mid functional reaching and sustained pinch. Pt req'd min/mod cues for movement pattern and maintaining good positioning of RUE with reach and with elbow/wrist movements ? ? ? ? ?HOME EXERCISE PROGRAM: ?Yellow theraputty ? ? OT Short Term  Goals - 07/14/21 1202   ? ?  ? OT SHORT TERM GOAL #1  ? Title Pt will be independent with HEP   ? Time 4   ? Period Weeks   ? Status Achieved   ? Target Date 07/07/21   ?  ? OT SHORT TERM GOAL #2  ? Title Pt will be independent with wear and care of any splints and/or orthoses PRN   ? Baseline currently with 2 braces from hospital, continue to assess   ? Time 4   ? Period Weeks   ? Status Achieved   ?  ? OT SHORT TERM GOAL #3  ? Title Pt will increase grip strength in RUE by 5 lbs or greater   ? Baseline R 24, L 61.2       3/14 22.9 R without splint, 32.6 R with radial nerve palsy splint   22.9 07/14/21  ? Time 4   ? Period Weeks   ? Status Not Met   ?  ? OT SHORT TERM GOAL #4  ? Title Pt will increase Box and Blocks score with RUE by at least 3 blocks   ? Baseline R 23 L 52  3/14 - 53 blocks RUE   ? Time 4   ? Period Weeks   ? Status Achieved   ? ?  ?  ? ?  ? ? ? OT Long Term Goals - 07/14/21 1040   ? ?  ?  OT LONG TERM GOAL #1  ? Title Pt will be independent with updated HEP   ? Time 10   ? Period Weeks   ? Status On-going   ? Target Date 08/18/21   ?  ? OT LONG TERM GOAL #2  ? Title Pt will increase grip strength in RUE by 10 lbs or greater   ? Baseline R 24, L 61.2   ? Time 10   ? Period Weeks   ? Status On-going   ?  ? OT LONG TERM GOAL #3  ? Title Pt will complete 9 hole peg test with RUE in 75 seconds or less.   ? Baseline R 86.44s, L 22.34s,    3/14 w/ splint - 24.35sec,   w/o splint 30.22 sec   ? Time 10   ? Period Weeks   ? Status Achieved   ?  ? OT LONG TERM GOAL #4  ? Title Pt will demonstrate ability to hold tennis racket in RUE   ? Time 10   ? Period Weeks   ? Status On-going   ?  ? OT LONG TERM GOAL #5  ? Title Pt will verbalize understanding of adapted strategies for increasing independence and safety with ADLs and IADLs (typing, handwriting, buttons, etc)   ? Time 10   ? Period Weeks   ? Status On-going   ? ?  ?  ? ?  ? ? ? Plan - 08/04/21 1134   ? ? Clinical Impression Statement Pt continues to  demonstrate increased active movement in RUE wrist however very hesitant for PROM with wrist extension resulting in stiffness and increased pain with stretch.   ? OT Occupational Profile and History Problem Focused Assessment - Including review of records relating to presenting problem   ? Occupational performance deficits (Please refer to evaluation for details): ADL's;IADL's;Leisure;Work   ? Body Structure / Function / Physical Skills ADL;Decreased knowledge of use of DME;Strength;Tone;GMC;Dexterity;Proprioception;UE functional use;ROM;IADL;Coordination;FMC   ? Rehab Potential Good   ? Clinical Decision Making Limited treatment options, no task modification necessary   ? Comorbidities Affecting Occupational Performance: None   ? Modification or Assistance to Complete Evaluation  No modification of tasks or assist necessary to complete eval   ? OT Frequency 2x / week   ? OT Duration Other (comment)   16 visits over 10 weeks d/t any scheduling conflicts  ? OT Treatment/Interventions Self-care/ADL training;Electrical Stimulation;Paraffin;Patient/family education;Manual Therapy;Neuromuscular education;Passive range of motion;Therapeutic exercise;Ultrasound;Contrast Bath;Therapeutic activities;Splinting;DME and/or AE instruction;Fluidtherapy   ? Plan continue progressing towards goals, d/c next session?   ? Consulted and Agree with Plan of Care Patient   ? ?  ?  ? ?  ? ? ? ?Zachery Conch, OT ?08/04/2021, 11:35 AM ? ?  ? ? ? ?

## 2021-08-12 ENCOUNTER — Ambulatory Visit: Payer: BC Managed Care – PPO | Admitting: Occupational Therapy

## 2021-08-12 ENCOUNTER — Encounter: Payer: Self-pay | Admitting: Occupational Therapy

## 2021-08-12 DIAGNOSIS — R208 Other disturbances of skin sensation: Secondary | ICD-10-CM

## 2021-08-12 DIAGNOSIS — R278 Other lack of coordination: Secondary | ICD-10-CM

## 2021-08-12 DIAGNOSIS — M6281 Muscle weakness (generalized): Secondary | ICD-10-CM

## 2021-08-12 DIAGNOSIS — R29818 Other symptoms and signs involving the nervous system: Secondary | ICD-10-CM | POA: Diagnosis not present

## 2021-08-12 NOTE — Therapy (Signed)
?OUTPATIENT OCCUPATIONAL THERAPY TREATMENT NOTE & DISCHARGE ? ? ?Patient Name: Ryan Nixon ?MRN: [id number] ?DOB:09/10/1960, 61 y.o., male ?Today's Date: 08/12/2021 ? ?PCP: London Pepper, MD ?REFERRING PROVIDER: London Pepper, MD ? ? OT End of Session - 08/12/21 1019   ? ? Visit Number 12   ? Number of Visits 17   ? Date for OT Re-Evaluation 08/18/21   ? Authorization Type BCBS, Aetna   ? Authorization Time Period VL: combined 60 PT/OT/Chiro (BCBS), VL: 120 (Aetna)   ? OT Start Time 1018   ? OT Stop Time 1041   D/C SESSION  ? OT Time Calculation (min) 23 min   ? Activity Tolerance Patient tolerated treatment well   ? Behavior During Therapy [facility] for tasks assessed/performed   ? ?  ?  ? ?  ? ? ?Past Medical History:  ?Diagnosis Date  ? Headache   ? Hyperlipidemia   ? Hypertension   ? ?History reviewed. No pertinent surgical history. ?Patient Active Problem List  ? Diagnosis Date Noted  ? 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 ? ?ONSET DATE: 06/01/21 ? ?REFERRING DIAG: R Radial Nerve Palsy  ? ?THERAPY DIAG:  ?Other lack of coordination ? ?Muscle weakness (generalized) ? ?Other symptoms and signs involving the nervous system ? ?Other disturbances of skin sensation ? ? ?PERTINENT HISTORY: HTN, HLD  ? ?PRECAUTIONS: None ? ?SUBJECTIVE: "using my hand better" ? ?PAIN:  ?Are you having pain? Yes: NPRS scale: 4/10 ?Pain location: thenar region of palm ?Pain description: stretch pain ?Aggravating factors: with stretch/wrist extension ?Relieving factors: stopping/resting ? ? ? ? ?OBJECTIVE:  ? ?TODAY'S TREATMENT:  ? ?08/12/21 ?Checked goals. See Below.  Reviewed education and PROM and AROM HEP ? ?OCCUPATIONAL THERAPY DISCHARGE SUMMARY ? ?Visits from Start of Care: 12 ? ?Current functional level related to goals / functional outcomes: ?Pt has met goals and demonstrates increased wrist extension active and with better control. ?  ?Remaining deficits: ?Continues with decreased strength with RUE wrist extension and grip strength ?   ?Education / Equipment: ?HEP and education  ? ?Patient agrees to discharge. Patient goals were partially met. Patient is being discharged due to maximized rehab potential. . ? ? ? ? ?08/04/21 ?Ultrasound ? 0.8w/cm2, 8 minutes, 3.3 mhz, 20% duty cycle - RUE supinator muscle belly ? 0.8w/cm2, 8 minutes, 1 mhz, 20% duty cycle - RUE thenar palm ? ?Resistance Clothespins 1-8# with RUE for mid functional reaching and sustained pinch. Pt req'd min/mod cues for movement pattern and maintaining good positioning of RUE with reach and with elbow/wrist movements ? ? ?HOME EXERCISE PROGRAM: ?Yellow theraputty ? ? OT Short Term Goals - 07/14/21 1202   ? ?  ? OT SHORT TERM GOAL #1  ? Title Pt will be independent with HEP   ? Time 4   ? Period Weeks   ? Status Achieved   ? Target Date 07/07/21   ?  ? OT SHORT TERM GOAL #2  ? Title Pt will be independent with wear and care of any splints and/or orthoses PRN   ? Baseline currently with 2 braces from hospital, continue to assess   ? Time 4   ? Period Weeks   ? Status Achieved   ?  ? OT SHORT TERM GOAL #3  ? Title Pt will increase grip strength in RUE by 5 lbs or greater   ? Baseline R 24, L 61.2       3/14 22.9 R without splint, 32.6 R with radial nerve palsy splint  22.9 07/14/21  ? Time 4   ? Period Weeks   ? Status Not Met   ?  ? OT SHORT TERM GOAL #4  ? Title Pt will increase Box and Blocks score with RUE by at least 3 blocks   ? Baseline R 23 L 52  3/14 - 53 blocks RUE   ? Time 4   ? Period Weeks   ? Status Achieved   ? ?  ?  ? ?  ? ? ? OT Long Term Goals - 07/14/21 1040   ? ?  ? OT LONG TERM GOAL #1  ? Title Pt will be independent with updated HEP   ? Time 10   ? Period Weeks   ? Status Achieved  ? Target Date 08/18/21   ?  ? OT LONG TERM GOAL #2  ? Title Pt will increase grip strength in RUE by 10 lbs or greater   ? Baseline R 24, L 61.2   ? Time 10   ? Period Weeks   ? Status Not Met  RUE 24lbs  ?  ? OT LONG TERM GOAL #3  ? Title Pt will complete 9 hole peg test with RUE in 75  seconds or less.   ? Baseline R 86.44s, L 22.34s,    3/14 w/ splint - 24.35sec,   w/o splint 30.22 sec   ? Time 10   ? Period Weeks   ? Status Achieved   ?  ? OT LONG TERM GOAL #4  ? Title Pt will demonstrate ability to hold tennis racket in RUE   ? Time 10   ? Period Weeks   ? Status Achieved  ?  ? OT LONG TERM GOAL #5  ? Title Pt will verbalize understanding of adapted strategies for increasing independence and safety with ADLs and IADLs (typing, handwriting, buttons, etc)   ? Time 10   ? Period Weeks   ? Status Achieved  ? ?  ?  ? ?  ? ? ? Plan - 08/04/21 1134   ? ? Clinical Impression Statement Pt has progressed towards goals and is independent with HEP and education.   ? OT Occupational Profile and History Problem Focused Assessment - Including review of records relating to presenting problem   ? Occupational performance deficits (Please refer to evaluation for details): ADL's;IADL's;Leisure;Work   ? Body Structure / Function / Physical Skills ADL;Decreased knowledge of use of DME;Strength;Tone;GMC;Dexterity;Proprioception;UE functional use;ROM;IADL;Coordination;FMC   ? Rehab Potential Good   ? Clinical Decision Making Limited treatment options, no task modification necessary   ? Comorbidities Affecting Occupational Performance: None   ? Modification or Assistance to Complete Evaluation  No modification of tasks or assist necessary to complete eval   ? OT Frequency 2x / week   ? OT Duration Other (comment)   16 visits over 10 weeks d/t any scheduling conflicts  ? OT Treatment/Interventions Self-care/ADL training;Electrical Stimulation;Paraffin;Patient/family education;Manual Therapy;Neuromuscular education;Passive range of motion;Therapeutic exercise;Ultrasound;Contrast Bath;Therapeutic activities;Splinting;DME and/or AE instruction;Fluidtherapy   ? Plan D/c OT  ? Consulted and Agree with Plan of Care Patient   ? ?  ?  ? ?  ? ? ? ?Zachery Conch, OT ?08/12/2021, 10:42 AM ? ?  ? ? ? ?

## 2021-09-09 DIAGNOSIS — I10 Essential (primary) hypertension: Secondary | ICD-10-CM | POA: Diagnosis not present

## 2021-09-09 DIAGNOSIS — E785 Hyperlipidemia, unspecified: Secondary | ICD-10-CM | POA: Diagnosis not present

## 2021-09-09 DIAGNOSIS — G5630 Lesion of radial nerve, unspecified upper limb: Secondary | ICD-10-CM | POA: Diagnosis not present

## 2021-09-10 ENCOUNTER — Other Ambulatory Visit: Payer: Self-pay

## 2021-09-10 NOTE — Patient Outreach (Signed)
[facility]) Care Management ? ?09/10/2021 ? ?Ryan Nixon ?24-Feb-1961 ?[id number] ? ? ?Telephone outreach to patient to obtain mRS was successfully completed. MRS= 1 ? ?Philmore Pali ?[place] Management Assistant ?[phone] ? ? ?

## 2021-09-30 ENCOUNTER — Telehealth: Payer: Self-pay | Admitting: Hematology

## 2021-09-30 ENCOUNTER — Other Ambulatory Visit: Payer: Self-pay

## 2021-09-30 DIAGNOSIS — E8809 Other disorders of plasma-protein metabolism, not elsewhere classified: Secondary | ICD-10-CM

## 2021-09-30 NOTE — Telephone Encounter (Signed)
Rescheduled upcoming appointment due to provider's schedule. Patient is aware of changes. 

## 2021-10-01 ENCOUNTER — Other Ambulatory Visit: Payer: Self-pay

## 2021-10-01 ENCOUNTER — Inpatient Hospital Stay: Payer: BC Managed Care – PPO | Attending: Hematology

## 2021-10-01 DIAGNOSIS — D472 Monoclonal gammopathy: Secondary | ICD-10-CM | POA: Insufficient documentation

## 2021-10-01 DIAGNOSIS — E8809 Other disorders of plasma-protein metabolism, not elsewhere classified: Secondary | ICD-10-CM

## 2021-10-01 LAB — CMP (CANCER CENTER ONLY)
ALT: 15 U/L (ref 0–44)
AST: 21 U/L (ref 15–41)
Albumin: 4.8 g/dL (ref 3.5–5.0)
Alkaline Phosphatase: 47 U/L (ref 38–126)
Anion gap: 6 (ref 5–15)
BUN: 9 mg/dL (ref 8–23)
CO2: 31 mmol/L (ref 22–32)
Calcium: 10.2 mg/dL (ref 8.9–10.3)
Chloride: 103 mmol/L (ref 98–111)
Creatinine: 0.89 mg/dL (ref 0.61–1.24)
GFR, Estimated: >60 mL/min (ref >60)
Glucose, Bld: 106 mg/dL — ABNORMAL HIGH (ref 70–99)
Potassium: 4.4 mmol/L (ref 3.5–5.1)
Sodium: 140 mmol/L (ref 135–145)
Total Bilirubin: 1.3 mg/dL — ABNORMAL HIGH (ref 0.3–1.2)
Total Protein: 8.4 g/dL — ABNORMAL HIGH (ref 6.5–8.1)

## 2021-10-01 LAB — CBC WITH DIFFERENTIAL (CANCER CENTER ONLY)
Abs Immature Granulocytes: 0.01 10*3/uL (ref 0.00–0.07)
Basophils Absolute: 0 10*3/uL (ref 0.0–0.1)
Basophils Relative: 0 %
Eosinophils Absolute: 0 10*3/uL (ref 0.0–0.5)
Eosinophils Relative: 0 %
HCT: 40 % (ref 39.0–52.0)
Hemoglobin: 13.6 g/dL (ref 13.0–17.0)
Immature Granulocytes: 0 %
Lymphocytes Relative: 25 %
Lymphs Abs: 1.4 10*3/uL (ref 0.7–4.0)
MCH: 33.9 pg (ref 26.0–34.0)
MCHC: 34 g/dL (ref 30.0–36.0)
MCV: 99.8 fL (ref 80.0–100.0)
Monocytes Absolute: 0.4 10*3/uL (ref 0.1–1.0)
Monocytes Relative: 6 %
Neutro Abs: 4 10*3/uL (ref 1.7–7.7)
Neutrophils Relative %: 69 %
Platelet Count: 189 10*3/uL (ref 150–400)
RBC: 4.01 MIL/uL — ABNORMAL LOW (ref 4.22–5.81)
RDW: 12.3 % (ref 11.5–15.5)
WBC Count: 5.9 10*3/uL (ref 4.0–10.5)
nRBC: 0 % (ref 0.0–0.2)

## 2021-10-06 LAB — MULTIPLE MYELOMA PANEL, SERUM
Albumin SerPl Elph-Mcnc: 4.5 g/dL — ABNORMAL HIGH (ref 2.9–4.4)
Albumin/Glob SerPl: 1.3 (ref 0.7–1.7)
Alpha 1: 0.2 g/dL (ref 0.0–0.4)
Alpha2 Glob SerPl Elph-Mcnc: 0.5 g/dL (ref 0.4–1.0)
B-Globulin SerPl Elph-Mcnc: 1 g/dL (ref 0.7–1.3)
Gamma Glob SerPl Elph-Mcnc: 1.9 g/dL — ABNORMAL HIGH (ref 0.4–1.8)
Globulin, Total: 3.6 g/dL (ref 2.2–3.9)
IgA: 113 mg/dL (ref 61–437)
IgG (Immunoglobin G), Serum: 2238 mg/dL — ABNORMAL HIGH (ref 603–1613)
IgM (Immunoglobulin M), Srm: 46 mg/dL (ref 20–172)
M Protein SerPl Elph-Mcnc: 0.9 g/dL — ABNORMAL HIGH
Total Protein ELP: 8.1 g/dL (ref 6.0–8.5)

## 2021-10-08 ENCOUNTER — Inpatient Hospital Stay: Payer: BC Managed Care – PPO | Admitting: Hematology

## 2021-10-09 ENCOUNTER — Inpatient Hospital Stay (HOSPITAL_BASED_OUTPATIENT_CLINIC_OR_DEPARTMENT_OTHER): Payer: BC Managed Care – PPO | Admitting: Hematology

## 2021-10-09 DIAGNOSIS — E8809 Other disorders of plasma-protein metabolism, not elsewhere classified: Secondary | ICD-10-CM | POA: Diagnosis not present

## 2021-10-12 ENCOUNTER — Telehealth: Payer: Self-pay | Admitting: Hematology

## 2021-10-12 NOTE — Telephone Encounter (Signed)
Left message with follow-up appointments per 6/9 los.

## 2021-10-16 NOTE — Progress Notes (Signed)
HEMATOLOGY/ONCOLOGYPHONE NOTE  Date of Service: .10/09/2021   Patient Care Team: London Pepper, MD as PCP - General (Family Medicine)  CHIEF COMPLAINTS/PURPOSE OF CONSULTATION:  Follow-up to discuss lab results for continued evaluation and management of monoclonal paraproteinemia  HISTORY OF PRESENTING ILLNESS:  Please see previous note for details on initial presentation  INTERVAL HISTORY:   .Marland KitchenI connected with Ryan Nixon on 10/09/2021 at  3:30 PM EDT by telephone visit and verified that I am speaking with the correct person using two identifiers.   I discussed the limitations, risks, security and privacy concerns of performing an evaluation and management service by telemedicine and the availability of in-person appointments. I also discussed with the patient that there may be a patient responsible charge related to this service. The patient expressed understanding and agreed to proceed.   Other persons participating in the visit and their role in the encounter: None  Patient's location: Home Provider's location: [place]  Chief Complaint: Discussion of lab results for follow-up of monoclonal paraproteinemia.  Patient notes no acute new concerns since his last clinic visit with [place] 6 months ago.  No focal bone pains.  No new fatigue.  No abnormal bleeding or bruising.  No fevers no chills no night sweats.  No unexpected weight loss. Labs done on 10/01/2021 were reviewed with the patient in detail.   MEDICAL HISTORY:  Hyperlipidemia Hypertension   SURGICAL HISTORY: No known surgical history   SOCIAL HISTORY: Social History   Socioeconomic History   Marital status: Married    Spouse name: Not on file   Number of children: Not on file   Years of education: Not on file   Highest education level: Not on file  Occupational History   Not on file  Tobacco Use   Smoking status: Never   Smokeless tobacco: Never  Substance and Sexual Activity   Alcohol  use: Yes    Alcohol/week: 2.0 standard drinks of alcohol    Types: 1 Cans of beer, 1 Glasses of wine per week    Comment: occasionally   Drug use: Not Currently   Sexual activity: Not on file  Other Topics Concern   Not on file  Social History Narrative   Not on file   Social Determinants of Health   Financial Resource Strain: Not on file  Food Insecurity: Not on file  Transportation Needs: Not on file  Physical Activity: Not on file  Stress: Not on file  Social Connections: Not on file  Intimate Partner Violence: Not on file    FAMILY HISTORY: No family history on file.  ALLERGIES:  has No Known Allergies. No known drug allergies   MEDICATIONS:  Current Outpatient Medications  Medication Sig Dispense Refill   Aspirin-Acetaminophen (GOODYS BACK & BODY PAIN) 500-325 MG PACK Take 1 Package by mouth daily as needed (pain).     atorvastatin (LIPITOR) 80 MG tablet Take 1 tablet (80 mg total) by mouth daily. 30 tablet 1   Ibuprofen-diphenhydrAMINE HCl (ADVIL PM) 200-25 MG CAPS Take 2 capsules by mouth at bedtime.     Multiple Vitamins-Minerals (MULTIVITAMIN WITH MINERALS) tablet Take 1 tablet by mouth daily.     naproxen (NAPROSYN) 500 MG tablet Take 500 mg by mouth 2 (two) times daily as needed for mild pain.     omeprazole (PRILOSEC) 20 MG capsule Take 20 mg by mouth daily as needed (acid reflux).     ondansetron (ZOFRAN) 8 MG tablet Take 8 mg by mouth  every 8 (eight) hours as needed for nausea.     SUMAtriptan (IMITREX) 100 MG tablet Take 100 mg by mouth every 2 (two) hours as needed for migraine. May repeat in 2 hours if headache persists or recurs.     No current facility-administered medications for this visit.    REVIEW OF SYSTEMS:   10 Point review of Systems was done is negative except as noted above.  PHYSICAL EXAMINATION: Telemedicine visit  LABORATORY DATA:  I have reviewed the data as listed  .    Latest Ref Rng & Units 10/01/2021    1:53 PM 06/01/2021     7:35 PM 06/01/2021    7:18 PM  CBC  WBC 4.0 - 10.5 K/uL 5.9   5.6   Hemoglobin 13.0 - 17.0 g/dL 13.6  13.9  13.0   Hematocrit 39.0 - 52.0 % 40.0  41.0  37.7   Platelets 150 - 400 K/uL 189   193     .    Latest Ref Rng & Units 10/01/2021    1:53 PM 06/01/2021    7:35 PM 06/01/2021    7:18 PM  CMP  Glucose 70 - 99 mg/dL 106  86  89   BUN 8 - 23 mg/dL _0 Creatinine 0.61 - 1.24 mg/dL 0.89  1.30  1.00   Sodium 135 - 145 mmol/L 140  142  140   Potassium 3.5 - 5.1 mmol/L 4.4  3.6  3.6   Chloride 98 - 111 mmol/L 103  103  103   CO2 22 - 32 mmol/L 31   28   Calcium 8.9 - 10.3 mg/dL 10.2   9.1   Total Protein 6.5 - 8.1 g/dL 8.4   7.7   Total Bilirubin 0.3 - 1.2 mg/dL 1.3   0.9   Alkaline Phos 38 - 126 U/L 47   47   AST 15 - 41 U/L 21   24   ALT 0 - 44 U/L 15   21    03/10/2020 Bone Marrow Bx Report ([id number]):    03/10/2020    RADIOGRAPHIC STUDIES: I have personally reviewed the radiological images as listed and agreed with the findings in the report. No results found.   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 No CT evidence of malignancy in the chest, abdomen, or pelvis. No lymphadenopathy. 2. Hepatic steatosis. 3. Descending and sigmoid diverticulosis without evidence of diverticulitis. 4. Mild, diffusely sclerotic appearance of the skeleton without focal lesion noted, of uncertain significance, most commonly seen with renal osteodystrophy."  -06/05/2019 Bone Scan Whole Body ([id number]) revealed "Normal bone scan." 2) Mild drop in hgb with mild borderline anemia  PLAN: -Discussed lab results with from 10/01/2021 in detail with the patient. CBC shows normal hemoglobin of 13.6 with normal WBC count and platelets CMP within normal limits with normal creatinine of 0.89 and normal calcium level of 10.2. Myeloma panel shows  improved M spike down to 0.9 g/dL from a previous high of 1.5 g/dL Patient has no clinical signs or symptoms or lab findings suggestive of progression of his plasma cell dyscrasia. No indication for additional evaluation with PET scan or bone marrow biopsy at this time.  FOLLOW UP: Phone visit with Dr. Irene Limbo in 6 months Labs 1 week prior to phone visit  The total time spent in  the appointment was 15 minutes*.  All of the patient's questions were answered with apparent satisfaction. The patient knows to call the clinic with any problems, questions or concerns.   [name] MD MS AAHIVMS [facility] Hematology/Oncology Physician [facility]  .*Total Encounter Time as defined by the Centers for Medicare and Medicaid Services includes, in addition to the face-to-face time of a patient visit (documented in the note above) non-face-to-face time: obtaining and reviewing outside history, ordering and reviewing medications, tests or procedures, care coordination (communications with other health care professionals or caregivers) and documentation in the medical record.

## 2022-04-02 ENCOUNTER — Other Ambulatory Visit: Payer: Self-pay

## 2022-04-02 DIAGNOSIS — E8809 Other disorders of plasma-protein metabolism, not elsewhere classified: Secondary | ICD-10-CM

## 2022-04-05 ENCOUNTER — Inpatient Hospital Stay: Payer: BC Managed Care – PPO | Attending: Hematology

## 2022-04-05 ENCOUNTER — Other Ambulatory Visit: Payer: Self-pay

## 2022-04-05 DIAGNOSIS — D472 Monoclonal gammopathy: Secondary | ICD-10-CM | POA: Insufficient documentation

## 2022-04-05 DIAGNOSIS — E8809 Other disorders of plasma-protein metabolism, not elsewhere classified: Secondary | ICD-10-CM

## 2022-04-05 LAB — CBC WITH DIFFERENTIAL (CANCER CENTER ONLY)
Abs Immature Granulocytes: 0.01 10*3/uL (ref 0.00–0.07)
Basophils Absolute: 0 10*3/uL (ref 0.0–0.1)
Basophils Relative: 0 %
Eosinophils Absolute: 0 10*3/uL (ref 0.0–0.5)
Eosinophils Relative: 0 %
HCT: 38.5 % — ABNORMAL LOW (ref 39.0–52.0)
Hemoglobin: 13.3 g/dL (ref 13.0–17.0)
Immature Granulocytes: 0 %
Lymphocytes Relative: 26 %
Lymphs Abs: 1.4 10*3/uL (ref 0.7–4.0)
MCH: 35.4 pg — ABNORMAL HIGH (ref 26.0–34.0)
MCHC: 34.5 g/dL (ref 30.0–36.0)
MCV: 102.4 fL — ABNORMAL HIGH (ref 80.0–100.0)
Monocytes Absolute: 0.5 10*3/uL (ref 0.1–1.0)
Monocytes Relative: 9 %
Neutro Abs: 3.5 10*3/uL (ref 1.7–7.7)
Neutrophils Relative %: 65 %
Platelet Count: 197 10*3/uL (ref 150–400)
RBC: 3.76 MIL/uL — ABNORMAL LOW (ref 4.22–5.81)
RDW: 12.6 % (ref 11.5–15.5)
WBC Count: 5.4 10*3/uL (ref 4.0–10.5)
nRBC: 0 % (ref 0.0–0.2)

## 2022-04-05 LAB — CMP (CANCER CENTER ONLY)
ALT: 12 U/L (ref 0–44)
AST: 19 U/L (ref 15–41)
Albumin: 5 g/dL (ref 3.5–5.0)
Alkaline Phosphatase: 46 U/L (ref 38–126)
Anion gap: 9 (ref 5–15)
BUN: 11 mg/dL (ref 8–23)
CO2: 29 mmol/L (ref 22–32)
Calcium: 9.9 mg/dL (ref 8.9–10.3)
Chloride: 102 mmol/L (ref 98–111)
Creatinine: 0.93 mg/dL (ref 0.61–1.24)
GFR, Estimated: >60 mL/min (ref >60)
Glucose, Bld: 89 mg/dL (ref 70–99)
Potassium: 3.8 mmol/L (ref 3.5–5.1)
Sodium: 140 mmol/L (ref 135–145)
Total Bilirubin: 1 mg/dL (ref 0.3–1.2)
Total Protein: 8.6 g/dL — ABNORMAL HIGH (ref 6.5–8.1)

## 2022-04-12 ENCOUNTER — Inpatient Hospital Stay: Payer: BC Managed Care – PPO | Admitting: Hematology

## 2022-04-13 LAB — MULTIPLE MYELOMA PANEL, SERUM
Albumin SerPl Elph-Mcnc: 4.3 g/dL (ref 2.9–4.4)
Albumin/Glob SerPl: 1.3 (ref 0.7–1.7)
Alpha 1: 0.2 g/dL (ref 0.0–0.4)
Alpha2 Glob SerPl Elph-Mcnc: 0.4 g/dL (ref 0.4–1.0)
B-Globulin SerPl Elph-Mcnc: 1 g/dL (ref 0.7–1.3)
Gamma Glob SerPl Elph-Mcnc: 1.9 g/dL — ABNORMAL HIGH (ref 0.4–1.8)
Globulin, Total: 3.5 g/dL (ref 2.2–3.9)
IgA: 125 mg/dL (ref 61–437)
IgG (Immunoglobin G), Serum: 2175 mg/dL — ABNORMAL HIGH (ref 603–1613)
IgM (Immunoglobulin M), Srm: 45 mg/dL (ref 20–172)
M Protein SerPl Elph-Mcnc: 1.4 g/dL — ABNORMAL HIGH
Total Protein ELP: 7.8 g/dL (ref 6.0–8.5)

## 2022-04-18 NOTE — Progress Notes (Signed)
Patient not contactable for phone visit-- results routed to PCP and to my RN to let him know his labs are stable and we shall see him in 6 monthsThis encounter was created in error - please disregard.

## 2022-05-06 ENCOUNTER — Telehealth: Payer: Self-pay

## 2022-05-06 NOTE — Telephone Encounter (Signed)
Contacted pt per Dr Irene Limbo: to let patient know his labs show an increase in M spike from 0.9 to 1.4g/dl but is similar to his previous levels about 1 yr ago. No anemia, hypercalcemia or significant renal changes to suggest progression to active Myeloma.   Pt acknowledged information and verbalized understanding.

## 2022-07-13 DIAGNOSIS — G5630 Lesion of radial nerve, unspecified upper limb: Secondary | ICD-10-CM | POA: Diagnosis not present

## 2022-07-13 DIAGNOSIS — E785 Hyperlipidemia, unspecified: Secondary | ICD-10-CM | POA: Diagnosis not present

## 2022-07-13 DIAGNOSIS — Z125 Encounter for screening for malignant neoplasm of prostate: Secondary | ICD-10-CM | POA: Diagnosis not present

## 2022-07-13 DIAGNOSIS — I10 Essential (primary) hypertension: Secondary | ICD-10-CM | POA: Diagnosis not present

## 2022-07-13 DIAGNOSIS — L309 Dermatitis, unspecified: Secondary | ICD-10-CM | POA: Diagnosis not present

## 2022-09-08 DIAGNOSIS — E785 Hyperlipidemia, unspecified: Secondary | ICD-10-CM | POA: Diagnosis not present

## 2022-10-07 ENCOUNTER — Other Ambulatory Visit: Payer: Self-pay

## 2022-10-07 DIAGNOSIS — E8809 Other disorders of plasma-protein metabolism, not elsewhere classified: Secondary | ICD-10-CM

## 2022-10-08 ENCOUNTER — Inpatient Hospital Stay: Payer: BC Managed Care – PPO | Attending: Family Medicine

## 2022-10-15 ENCOUNTER — Inpatient Hospital Stay: Payer: BC Managed Care – PPO | Admitting: Hematology

## 2023-06-27 DIAGNOSIS — Z0000 Encounter for general adult medical examination without abnormal findings: Secondary | ICD-10-CM | POA: Diagnosis not present

## 2023-06-27 DIAGNOSIS — R5381 Other malaise: Secondary | ICD-10-CM | POA: Diagnosis not present

## 2023-06-27 DIAGNOSIS — D649 Anemia, unspecified: Secondary | ICD-10-CM | POA: Diagnosis not present

## 2023-06-27 DIAGNOSIS — D472 Monoclonal gammopathy: Secondary | ICD-10-CM | POA: Diagnosis not present

## 2023-06-27 DIAGNOSIS — Z125 Encounter for screening for malignant neoplasm of prostate: Secondary | ICD-10-CM | POA: Diagnosis not present

## 2023-06-27 DIAGNOSIS — I10 Essential (primary) hypertension: Secondary | ICD-10-CM | POA: Diagnosis not present

## 2023-06-27 DIAGNOSIS — E785 Hyperlipidemia, unspecified: Secondary | ICD-10-CM | POA: Diagnosis not present

## 2023-06-27 DIAGNOSIS — Z23 Encounter for immunization: Secondary | ICD-10-CM | POA: Diagnosis not present

## 2023-07-25 DIAGNOSIS — I10 Essential (primary) hypertension: Secondary | ICD-10-CM | POA: Diagnosis not present

## 2023-09-05 DIAGNOSIS — D472 Monoclonal gammopathy: Secondary | ICD-10-CM | POA: Diagnosis not present

## 2023-09-05 DIAGNOSIS — R110 Nausea: Secondary | ICD-10-CM | POA: Diagnosis not present

## 2023-09-05 DIAGNOSIS — R634 Abnormal weight loss: Secondary | ICD-10-CM | POA: Diagnosis not present

## 2023-09-27 NOTE — Progress Notes (Signed)
 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  I had a phone visit with patient on 10/09/2021 and he was doing well overall with no new medical complaints.   Patient reports that he is doing fairly well since his last clinical visit. He denies any new bone pain.   Patient complains of headaches and mild fatigue which he atrributes to age.   He complains of skin peeling in his bilateral hands and soles of the feet related to Hyperkeratosis. Patient has tried using cream, which is not improving symptoms. He denies any hand exposure to chemicals.   Patient works in [employer] and reports frequent use of the computer.   He complains of some weight loss possibly due to flu infection.   He complains of excessive sweating at night, but not during the day.   He does report chills but denies fever.   Patient denies any abdominal pain.  His PCP is Dr. Maryrose Soja.   MEDICAL HISTORY:  Hyperlipidemia Hypertension   SURGICAL HISTORY: No known surgical history   SOCIAL HISTORY: Social History   Socioeconomic History   Marital status: Married    Spouse name: Not on file   Number of children: Not on file   Years of education: Not on file   Highest education level: Not on file  Occupational History   Not on file  Tobacco Use   Smoking status: Never   Smokeless tobacco: Never  Substance and Sexual Activity   Alcohol use: Yes    Alcohol/week: 2.0 standard drinks of alcohol    Types: 1 Cans of beer, 1 Glasses of wine per week    Comment: occasionally    Drug use: Not Currently   Sexual activity: Not on file  Other Topics Concern   Not on file  Social History Narrative   Not on file   Social Drivers of Health   Financial Resource Strain: Not on file  Food Insecurity: Not on file  Transportation Needs: Not on file  Physical Activity: Not on file  Stress: Not on file  Social Connections: Unknown (09/11/2021)   Received from [facility], [facility]   Social Network    Social Network: Not on file  Intimate Partner Violence: Unknown (08/04/2021)   Received from [facility], [facility]   HITS    Physically Hurt: Not on file    Insult or Talk Down To: Not on file    Threaten Physical Harm: Not on file    Scream or Curse: Not on file    FAMILY HISTORY: No family history on file.  ALLERGIES:  has no known allergies. No known drug allergies   MEDICATIONS:  Current Outpatient Medications  Medication Sig Dispense Refill   Aspirin-Acetaminophen  (GOODYS BACK & BODY PAIN) 500-325 MG PACK Take 1 Package by mouth daily as needed (pain).     atorvastatin  (LIPITOR ) 80 MG tablet Take 1 tablet (80 mg total) by mouth daily. 30 tablet 1   Ibuprofen-diphenhydrAMINE HCl (ADVIL PM) 200-25 MG CAPS Take 2 capsules by mouth at bedtime.     Multiple Vitamins-Minerals (MULTIVITAMIN WITH MINERALS) tablet Take 1 tablet  by mouth daily.     naproxen (NAPROSYN) 500 MG tablet Take 500 mg by mouth 2 (two) times daily as needed for mild pain.     omeprazole (PRILOSEC) 20 MG capsule Take 20 mg by mouth daily as needed (acid reflux).     ondansetron (ZOFRAN) 8 MG tablet Take 8 mg by mouth every 8 (eight) hours as needed for nausea.     SUMAtriptan (IMITREX) 100 MG tablet Take 100 mg by mouth every 2 (two) hours as needed for migraine. May repeat in 2 hours if headache persists or recurs.     No current facility-administered medications for this visit.    REVIEW OF SYSTEMS:   10 Point review of Systems was done is negative except as noted  above.  PHYSICAL EXAMINATION: .BP 134/89   Pulse 68   Temp (!) 97.5 F (36.4 C)   Resp 18   Wt 132 lb 9.6 oz (60.1 kg)   SpO2 99%   BMI 20.77 kg/m  GENERAL:alert, in no acute distress and comfortable SKIN: no acute rashes,  EYES: conjunctiva are pink and non-injected, sclera anicteric OROPHARYNX: MMM, no exudates, no oropharyngeal erythema or ulceration NECK: supple, no JVD LYMPH:  no palpable lymphadenopathy in the cervical, axillary or inguinal regions LUNGS: clear to auscultation b/l with normal respiratory effort HEART: regular rate & rhythm ABDOMEN:  normoactive bowel sounds , non tender, not distended. Extremity: no pedal edema PSYCH: alert & oriented x 3 with fluent speech NEURO: no focal motor/sensory deficits   LABORATORY DATA:  I have reviewed the data as listed  .    Latest Ref Rng & Units 04/05/2022    3:38 PM 10/01/2021    1:53 PM 06/01/2021    7:35 PM  CBC  WBC 4.0 - 10.5 K/uL 5.4  5.9    Hemoglobin 13.0 - 17.0 g/dL 40.9  81.1  91.4   Hematocrit 39.0 - 52.0 % 38.5  40.0  41.0   Platelets 150 - 400 K/uL 197  189      .    Latest Ref Rng & Units 04/05/2022    3:38 PM 10/01/2021    1:53 PM 06/01/2021    7:35 PM  CMP  Glucose 70 - 99 mg/dL 89  782  86   BUN 8 - 23 mg/dL 11  9  13    Creatinine 0.61 - 1.24 mg/dL 9.56  2.13  0.86   Sodium 135 - 145 mmol/L 140  140  142   Potassium 3.5 - 5.1 mmol/L 3.8  4.4  3.6   Chloride 98 - 111 mmol/L 102  103  103   CO2 22 - 32 mmol/L 29  31    Calcium  8.9 - 10.3 mg/dL 9.9  57.8    Total Protein 6.5 - 8.1 g/dL 8.6  8.4    Total Bilirubin 0.3 - 1.2 mg/dL 1.0  1.3    Alkaline Phos 38 - 126 U/L 46  47    AST 15 - 41 U/L 19  21    ALT 0 - 44 U/L 12  15     03/10/2020 Bone Marrow Bx Report ([id number]):    03/10/2020    RADIOGRAPHIC STUDIES: I have personally reviewed the radiological images as listed and agreed with the findings in the report. No results found.   ASSESSMENT & PLAN:   63 y.o. 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 No CT evidence of malignancy in the chest, abdomen, or pelvis. No lymphadenopathy. 2. Hepatic steatosis. 3. Descending and sigmoid diverticulosis without evidence of diverticulitis. 4. Mild, diffusely sclerotic appearance of the skeleton without focal lesion noted, of uncertain significance, most commonly seen with renal osteodystrophy."  -06/05/2019 Bone Scan Whole Body ([id number]) revealed "Normal bone scan." 2) Mild drop in hgb with mild borderline anemia  PLAN:  -Discussed lab results on 09/28/2023 in detail with patient. CBC normal, showed WBC of 5.5K, hemoglobin of 13.7, and platelets of 171K. -myeloma panel from today show stable M spike of 1.3g/dl -myeloma panel from 29/09/6211 showed M protein 1.4 g/dL -CMP pending at time of clinical visit -will follow up remaining labs -Patient has no clinical signs or symptoms or lab findings suggestive of progression of his plasma cell dyscrasia. -No indication for additional evaluation with PET scan or bone marrow biopsy at this time. 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 Maryrose Soja, in 6 months  -will plan for phone visit in 1 year -answered all of patient's questions in detail  FOLLOW UP: Phone visit with Dr Salomon Cree in 11 months Labs 2 weeks prior to phone visit F/u with PCP in 6 months with labs  The total time spent in the appointment was 21 minutes* .  All of the patient's questions were answered with apparent satisfaction. The patient knows to call the clinic with any problems, questions or concerns.   Jacquelyn Matt MD MS AAHIVMS [facility] Hematology/Oncology Physician [facility]  .*Total Encounter Time as  defined by the Centers for Medicare and Medicaid Services includes, in addition to the face-to-face time of a patient visit (documented in the note above) non-face-to-face time: obtaining and reviewing outside history, ordering and reviewing medications, tests or procedures, care coordination (communications with other health care professionals or caregivers) and documentation in the medical record.   I,Mitra Faeizi,acting as a Neurosurgeon for Jacquelyn Matt, MD.,have documented all relevant documentation on the behalf of Jacquelyn Matt, MD,as directed by  Jacquelyn Matt, MD while in the presence of Jacquelyn Matt, MD.  .I have reviewed the above documentation for accuracy and completeness, and I agree with the above. .Neilani Duffee Kishore Amillya Chavira MD

## 2023-09-28 ENCOUNTER — Inpatient Hospital Stay: Attending: Hematology

## 2023-09-28 ENCOUNTER — Inpatient Hospital Stay (HOSPITAL_BASED_OUTPATIENT_CLINIC_OR_DEPARTMENT_OTHER): Admitting: Hematology

## 2023-09-28 VITALS — BP 134/89 | HR 68 | Temp 97.5°F | Resp 18 | Wt 132.6 lb

## 2023-09-28 DIAGNOSIS — E8809 Other disorders of plasma-protein metabolism, not elsewhere classified: Secondary | ICD-10-CM

## 2023-09-28 DIAGNOSIS — K5730 Diverticulosis of large intestine without perforation or abscess without bleeding: Secondary | ICD-10-CM | POA: Diagnosis not present

## 2023-09-28 DIAGNOSIS — D472 Monoclonal gammopathy: Secondary | ICD-10-CM | POA: Diagnosis not present

## 2023-09-28 DIAGNOSIS — D649 Anemia, unspecified: Secondary | ICD-10-CM | POA: Diagnosis not present

## 2023-09-28 DIAGNOSIS — K760 Fatty (change of) liver, not elsewhere classified: Secondary | ICD-10-CM | POA: Diagnosis not present

## 2023-09-28 LAB — CBC WITH DIFFERENTIAL (CANCER CENTER ONLY)
Abs Immature Granulocytes: 0 10*3/uL (ref 0.00–0.07)
Basophils Absolute: 0 10*3/uL (ref 0.0–0.1)
Basophils Relative: 0 %
Eosinophils Absolute: 0 10*3/uL (ref 0.0–0.5)
Eosinophils Relative: 1 %
HCT: 40.8 % (ref 39.0–52.0)
Hemoglobin: 13.7 g/dL (ref 13.0–17.0)
Immature Granulocytes: 0 %
Lymphocytes Relative: 25 %
Lymphs Abs: 1.4 10*3/uL (ref 0.7–4.0)
MCH: 33.3 pg (ref 26.0–34.0)
MCHC: 33.6 g/dL (ref 30.0–36.0)
MCV: 99.3 fL (ref 80.0–100.0)
Monocytes Absolute: 0.4 10*3/uL (ref 0.1–1.0)
Monocytes Relative: 8 %
Neutro Abs: 3.6 10*3/uL (ref 1.7–7.7)
Neutrophils Relative %: 66 %
Platelet Count: 171 10*3/uL (ref 150–400)
RBC: 4.11 MIL/uL — ABNORMAL LOW (ref 4.22–5.81)
RDW: 13.5 % (ref 11.5–15.5)
WBC Count: 5.5 10*3/uL (ref 4.0–10.5)
nRBC: 0 % (ref 0.0–0.2)

## 2023-09-28 LAB — CMP (CANCER CENTER ONLY)
ALT: 26 U/L (ref 0–44)
AST: 29 U/L (ref 15–41)
Albumin: 5.1 g/dL — ABNORMAL HIGH (ref 3.5–5.0)
Alkaline Phosphatase: 65 U/L (ref 38–126)
Anion gap: 8 (ref 5–15)
BUN: 6 mg/dL — ABNORMAL LOW (ref 8–23)
CO2: 33 mmol/L — ABNORMAL HIGH (ref 22–32)
Calcium: 10.1 mg/dL (ref 8.9–10.3)
Chloride: 100 mmol/L (ref 98–111)
Creatinine: 0.98 mg/dL (ref 0.61–1.24)
GFR, Estimated: >60 mL/min (ref >60)
Glucose, Bld: 82 mg/dL (ref 70–99)
Potassium: 3.4 mmol/L — ABNORMAL LOW (ref 3.5–5.1)
Sodium: 141 mmol/L (ref 135–145)
Total Bilirubin: 0.8 mg/dL (ref 0.0–1.2)
Total Protein: 8.9 g/dL — ABNORMAL HIGH (ref 6.5–8.1)

## 2023-10-02 LAB — MULTIPLE MYELOMA PANEL, SERUM
Albumin SerPl Elph-Mcnc: 4.1 g/dL (ref 2.9–4.4)
Albumin/Glob SerPl: 1.1 (ref 0.7–1.7)
Alpha 1: 0.2 g/dL (ref 0.0–0.4)
Alpha2 Glob SerPl Elph-Mcnc: 0.5 g/dL (ref 0.4–1.0)
B-Globulin SerPl Elph-Mcnc: 1.1 g/dL (ref 0.7–1.3)
Gamma Glob SerPl Elph-Mcnc: 2 g/dL — ABNORMAL HIGH (ref 0.4–1.8)
Globulin, Total: 3.8 g/dL (ref 2.2–3.9)
IgA: 117 mg/dL (ref 61–437)
IgG (Immunoglobin G), Serum: 2184 mg/dL — ABNORMAL HIGH (ref 603–1613)
IgM (Immunoglobulin M), Srm: 44 mg/dL (ref 20–172)
M Protein SerPl Elph-Mcnc: 1.3 g/dL — ABNORMAL HIGH
Total Protein ELP: 7.9 g/dL (ref 6.0–8.5)

## 2023-10-23 DIAGNOSIS — Z20822 Contact with and (suspected) exposure to covid-19: Secondary | ICD-10-CM | POA: Diagnosis not present

## 2023-10-23 DIAGNOSIS — B349 Viral infection, unspecified: Secondary | ICD-10-CM | POA: Diagnosis not present

## 2024-03-20 DIAGNOSIS — R079 Chest pain, unspecified: Secondary | ICD-10-CM | POA: Diagnosis not present

## 2024-03-20 DIAGNOSIS — M25511 Pain in right shoulder: Secondary | ICD-10-CM | POA: Diagnosis not present

## 2024-03-21 DIAGNOSIS — R9431 Abnormal electrocardiogram [ECG] [EKG]: Secondary | ICD-10-CM | POA: Diagnosis not present

## 2024-08-14 ENCOUNTER — Other Ambulatory Visit

## 2024-08-28 ENCOUNTER — Telehealth: Admitting: Hematology
